# Patient Record
Sex: Male | Born: 1955 | Race: White | Hispanic: No | Marital: Married | Smoking: Never smoker
Health system: Southern US, Community
[De-identification: ages and names within clinical notes are randomized; demographics above are authoritative.]

## PROBLEM LIST (undated history)

## (undated) DIAGNOSIS — J45909 Unspecified asthma, uncomplicated: Secondary | ICD-10-CM

## (undated) DIAGNOSIS — E785 Hyperlipidemia, unspecified: Secondary | ICD-10-CM

## (undated) HISTORY — DX: Unspecified asthma, uncomplicated: J45.909

## (undated) HISTORY — DX: Hyperlipidemia, unspecified: E78.5

---

## 2017-04-04 ENCOUNTER — Ambulatory Visit (INDEPENDENT_AMBULATORY_CARE_PROVIDER_SITE_OTHER): Payer: 59 | Admitting: Cardiology

## 2017-04-04 ENCOUNTER — Encounter (INDEPENDENT_AMBULATORY_CARE_PROVIDER_SITE_OTHER): Payer: Self-pay

## 2017-04-04 ENCOUNTER — Encounter: Payer: Self-pay | Admitting: Cardiology

## 2017-04-04 VITALS — BP 116/60 | HR 68 | Ht 71.0 in | Wt 231.0 lb

## 2017-04-04 DIAGNOSIS — E782 Mixed hyperlipidemia: Secondary | ICD-10-CM

## 2017-04-04 DIAGNOSIS — R0789 Other chest pain: Secondary | ICD-10-CM | POA: Insufficient documentation

## 2017-04-04 DIAGNOSIS — R0609 Other forms of dyspnea: Secondary | ICD-10-CM

## 2017-04-04 DIAGNOSIS — R079 Chest pain, unspecified: Secondary | ICD-10-CM | POA: Insufficient documentation

## 2017-04-04 DIAGNOSIS — I200 Unstable angina: Secondary | ICD-10-CM | POA: Diagnosis not present

## 2017-04-04 DIAGNOSIS — R0600 Dyspnea, unspecified: Secondary | ICD-10-CM

## 2017-04-04 NOTE — Patient Instructions (Signed)
Medication Instructions:   Your physician recommends that you continue on your current medications as directed. Please refer to the Current Medication list given to you today.      Testing/Procedures:  CORONARY CT WITH FFR TO BE SCHEDULED ASAP PER DR NELSON---DR NELSON TO READ THIS STUDY      Follow-Up:  BASED ON YOUR CT RESULTS       If you need a refill on your cardiac medications before your next appointment, please call your pharmacy.

## 2017-04-04 NOTE — Progress Notes (Signed)
Cardiology Office Note:    Date:  04/04/2017   ID:  Leonard LamasJimmy Hawkins, DOB 04-06-1956, MRN [id number]  PCP:  System, Pcp Not In  Cardiologist:  Tobias AlexanderKatarina Cosima Prentiss, MD    Referring MD: Dr Charlies ConstableBruce Brodie  Chief complain: Chest pain  History of Present Illness:    Leonard Hawkins is a 61 y.o. male with a hx of Premature coronary artery disease in his father, cardiomegaly in his brother, hyperlipidemia who has been experiencing exertional dyspnea on exertion associated with dizziness and diaphoresis and retrosternal chest pressure. He has had several episodes in the last week including when he walk with his family and he couldn't finish the walk as he felt profoundly short of breath. He was tested by his primary care physician prolapse recently and was found to have elevated LDL for which she was started on Zocor and stopped taking it as he developed significant myalgias. He denies any prior syncope. No palpitations or claudications, no lower extremity edema orthopnea or proximal nocturnal dyspnea.  Labs from March 2018: Triglycerides 164, HDL 51, LDL 144 Normal electrolytes, creatinine 1.1 AST 20 ALT 31 glucose 113  Past Medical History:  Diagnosis Date  . Asthma   . Hyperlipidemia     History reviewed. No pertinent surgical history.  Current Medications: Current Meds  Medication Sig  . esomeprazole (NEXIUM) 40 MG capsule Take 1 capsule by mouth daily.     Allergies:   Cashew nut oil and Polyethylene glycol   Social History   Social History  . Marital status: Married    Spouse name: N/A  . Number of children: N/A  . Years of education: N/A   Social History Main Topics  . Smoking status: Never Smoker  . Smokeless tobacco: Former NeurosurgeonUser    Quit date: 04/04/1977  . Alcohol use Yes     Comment: social drinker  . Drug use: No  . Sexual activity: Yes    Birth control/ protection: None   Other Topics Concern  . None   Social History Narrative  . None    Family History: The  patient's family history includes AAA (abdominal aortic aneurysm) in his sister; Heart attack in his father; Heart disease in his father; Ovarian cancer in his mother.   ROS:   Please see the history of present illness.     All other systems reviewed and are negative.  EKGs/Labs/Other Studies Reviewed:    The following studies were reviewed today:   EKG:  EKG is  ordered today.  The ekg ordered today demonstrates Normal sinus rhythm minimal voltage criteria for LVH otherwise normal EKG no prior EKG available for comparison.  Recent Labs: No results found for requested labs within last 8760 hours.   Recent Lipid Panel No results found for: CHOL, TRIG, HDL, CHOLHDL, VLDL, LDLCALC, LDLDIRECT  Physical Exam:    VS:  BP 116/60   Pulse 68   Ht 5\' 11"  (1.803 m)   Wt 231 lb (104.8 kg)   SpO2 98%   BMI 32.22 kg/m     Wt Readings from Last 3 Encounters:  04/04/17 231 lb (104.8 kg)     GEN:  Well nourished, well developed in no acute distress HEENT: Normal NECK: No JVD; No carotid bruits LYMPHATICS: No lymphadenopathy CARDIAC: RRR, no murmurs, rubs, gallops RESPIRATORY:  Clear to auscultation without rales, wheezing or rhonchi  ABDOMEN: Soft, non-tender, non-distended MUSCULOSKELETAL:  No edema; No deformity  SKIN: Warm and dry NEUROLOGIC:  Alert and oriented x 3 PSYCHIATRIC:  Normal affect   ASSESSMENT:    1. Atypical chest pain    PLAN:    In order of problems listed above:  1. Typical Exertional chest pain shortness of breath and diaphoresis with known untreated hyperlipidemia and family history of early CAD, we'll schedule a coronary CTA with calcium score and if needed CT FFR for complete evaluation of the CAD burden.  2. Hyperlipidemia - we will obtain records from Dr. Clinton Sawyer for lipids and CMP, he is intolerant to Zocor based on results we will try Crestor next and see if tolerated. Dose will be decided based on CAD burden on coronary CTA.   Medication  Adjustments/Labs and Tests Ordered: Current medicines are reviewed at length with the patient today.  Concerns regarding medicines are outlined above. Labs and tests ordered and medication changes are outlined in the patient instructions below:  There are no Patient Instructions on file for this visit.   Signed, Tobias Alexander, MD  04/04/2017 11:31 AM     [facility]

## 2017-04-05 ENCOUNTER — Telehealth (HOSPITAL_COMMUNITY): Payer: Self-pay | Admitting: *Deleted

## 2017-04-05 ENCOUNTER — Encounter: Payer: Self-pay | Admitting: Cardiology

## 2017-04-05 ENCOUNTER — Telehealth: Payer: Self-pay | Admitting: *Deleted

## 2017-04-05 DIAGNOSIS — R072 Precordial pain: Secondary | ICD-10-CM

## 2017-04-05 DIAGNOSIS — R0609 Other forms of dyspnea: Secondary | ICD-10-CM

## 2017-04-05 NOTE — Telephone Encounter (Signed)
Pts calcium score is scheduled same day as myoview, on 04/08/17 at 1145.  Left a detailed message on the pts confirmed VM that this was approved to be scheduled same day as his myoview, and this will cost $150.  Advised the pt to call back with any further questions regarding message left.

## 2017-04-05 NOTE — Telephone Encounter (Signed)
Left message on voicemail in reference to upcoming appointment scheduled for 04/08/17. Phone number given for a call back so details instructions can be given. Leonard ChimesSharon Hawkins

## 2017-04-05 NOTE — Telephone Encounter (Signed)
-----   Message from [facility] Center[facility]armaine Hall sent at 04/05/2017 12:09 PM EDT ----- Regarding: Denied CCTA w/FFR Dr. Delton SeeNelson  Cardiac CTA was denied by insurance d/t pt hasn't had exercise imaging aka nuc stress test that was uninterpretable, suspected false + or unequivocable.  You can speak w/them at [phone]#[id number] however pt doesn't meet medical criteria and don't think it will change the denial.  Thanks  Charmaine

## 2017-04-05 NOTE — Telephone Encounter (Signed)
Order for exercise myoview placed for chest pain and DOE, per Dr Delton SeeNelson.  Pts Coronary CT W FFR was denied by [place].  Pt will have exercise myoview done on Monday 6/11 at 0945.  Omar PersonSharon Ferguson to send for a STAT pre-cert on this test scheduled for Monday 6/11.  Jasmine DecemberSharon to also check with our CT dept to see if we could arrange for a Calcium Score to be done same day, as he comes in for his myoview.   Spoke with the pt and informed him of exercise myoview scheduled for Monday 6/11 at 0945, he needs to arrive at 0915.   Went over exercise [employer] Pont[employer]oview instructions with the pt over the phone.  Pt verbalized understanding and agrees with this plan.   Also informed the pt that per Dr Delton SeeNelson, we will order for him to have a calcium score done as well.  Informed the pt that this is a $150 flat rate and we also do these in the office.  Informed the pt that we are trying to have this arranged same day as his myoview, but I will have to call him back to confirm this, for our CT scheduler stepped out of the office for lunch.  Pt verbalized understanding and agreed to this plan as well.

## 2017-04-08 ENCOUNTER — Telehealth: Payer: Self-pay | Admitting: *Deleted

## 2017-04-08 ENCOUNTER — Ambulatory Visit (INDEPENDENT_AMBULATORY_CARE_PROVIDER_SITE_OTHER)
Admission: RE | Admit: 2017-04-08 | Discharge: 2017-04-08 | Disposition: A | Discharge status: Home / Self Care | Payer: 59 | Source: Ambulatory Visit | Attending: Cardiology | Admitting: Cardiology

## 2017-04-08 ENCOUNTER — Ambulatory Visit (HOSPITAL_COMMUNITY): Payer: 59 | Attending: Cardiovascular Disease

## 2017-04-08 DIAGNOSIS — R0609 Other forms of dyspnea: Secondary | ICD-10-CM | POA: Diagnosis not present

## 2017-04-08 DIAGNOSIS — R931 Abnormal findings on diagnostic imaging of heart and coronary circulation: Secondary | ICD-10-CM | POA: Insufficient documentation

## 2017-04-08 DIAGNOSIS — R072 Precordial pain: Secondary | ICD-10-CM

## 2017-04-08 DIAGNOSIS — E785 Hyperlipidemia, unspecified: Secondary | ICD-10-CM | POA: Insufficient documentation

## 2017-04-08 LAB — MYOCARDIAL PERFUSION IMAGING
Estimated workload: 11.7 METS
Exercise duration (min): 10 min
Exercise duration (sec): 0 s
LV dias vol: 124 mL (ref 62–150)
LV sys vol: 61 mL
MPHR: 160 {beats}/min
Peak HR: 146 {beats}/min
Percent HR: 92 %
Percent of predicted max HR: 91 %
RATE: 0.37
RPE: 18
Rest HR: 60 {beats}/min
SDS: 2
SRS: 4
SSS: 6
Stage 1 DBP: 101 mmHg
Stage 1 Grade: 0 %
Stage 1 HR: 73 {beats}/min
Stage 1 SBP: 148 mmHg
Stage 1 Speed: 0 mph
Stage 2 Grade: 0 %
Stage 2 HR: 72 {beats}/min
Stage 2 Speed: 0 mph
Stage 3 DBP: 95 mmHg
Stage 3 Grade: 10 %
Stage 3 HR: 85 {beats}/min
Stage 3 SBP: 161 mmHg
Stage 3 Speed: 1.7 mph
Stage 4 DBP: 95 mmHg
Stage 4 Grade: 12 %
Stage 4 HR: 105 {beats}/min
Stage 4 SBP: 152 mmHg
Stage 4 Speed: 2.5 mph
Stage 5 DBP: 88 mmHg
Stage 5 Grade: 14 %
Stage 5 HR: 134 {beats}/min
Stage 5 SBP: 170 mmHg
Stage 5 Speed: 3.4 mph
Stage 6 Grade: 16 %
Stage 6 HR: 146 {beats}/min
Stage 6 Speed: 4.2 mph
Stage 7 DBP: 85 mmHg
Stage 7 Grade: 0 %
Stage 7 HR: 129 {beats}/min
Stage 7 SBP: 154 mmHg
Stage 7 Speed: 0 mph
Stage 8 DBP: 91 mmHg
Stage 8 Grade: 0 %
Stage 8 HR: 85 {beats}/min
Stage 8 SBP: 151 mmHg
Stage 8 Speed: 0 mph
TID: 0.86

## 2017-04-08 MED ORDER — ROSUVASTATIN CALCIUM 10 MG PO TABS: 10.0000 mg | ORAL_TABLET | Freq: Every day | ORAL | 1 refills | Status: DC

## 2017-04-08 MED ORDER — TECHNETIUM TC 99M TETROFOSMIN IV KIT
32.8000 | PACK | Freq: Once | INTRAVENOUS | Status: AC | PRN
  Administered 2017-04-08: 32.8 via INTRAVENOUS
  Filled 2017-04-08: qty 33

## 2017-04-08 MED ORDER — TECHNETIUM TC 99M TETROFOSMIN IV KIT
10.8000 | PACK | Freq: Once | INTRAVENOUS | Status: AC | PRN
  Administered 2017-04-08: 10.8 via INTRAVENOUS
  Filled 2017-04-08: qty 11

## 2017-04-08 NOTE — Telephone Encounter (Signed)
-----   Message from Lars MassonKatarina H Nelson, MD sent at 04/08/2017  3:44 PM EDT ----- Very high calcium score, no ischemia on stress test, however artifact on the study, he will need further evaluation with coronary CTA, please order sometimes in July (we will have a new scanner by then). Please prescribe crestor 10 mg po daily, repeat lipids and CMP in 1 month.  I have talked to him on the phone, he should start crestor after he comes back from [place] on 04/19/2017. Thank you, Aris LotKatarina

## 2017-04-08 NOTE — Telephone Encounter (Signed)
-----   Message from Katarina H Nelson, MD sent at 04/08/2017  3:44 PM EDT ----- Very high calcium score, no ischemia on stress test, however artifact on the study, he will need further evaluation with coronary CTA, please order sometimes in July (we will have a new scanner by then). Please prescribe crestor 10 mg po daily, repeat lipids and CMP in 1 month.  I have talked to him on the phone, he should start crestor after he comes back from [place] on 04/19/2017. Thank you, Katarina 

## 2017-04-08 NOTE — Telephone Encounter (Signed)
Notified the pt that per Dr Delton SeeNelson, he had a very high calcium score, no ischemia on stress test, however artifact on the study, he will need further evaluation with coronary CTA, please order sometimes in July (we will have a new scanner by then).   Also informed the pt that per Dr Delton SeeNelson, we will prescribe him crestor 10 mg po daily, repeat lipids and CMP in 1 month.   I have talked to him on the phone, he should start crestor after he comes back from [place] on 04/19/2017.  Thank you,  Aris LotKatarina  Confirmed the pharmacy of choice with the pt.  Informed the pt that per Dr Delton SeeNelson, he should start taking this on 6/22, when he comes back from his trip.  Scheduled the pt a lab appt for 05/17/17 to check a cmet and lipids.  Pt is aware to come fasting to this lab appt.  Informed the pt that I will place the order for him to have a coronary CTA arranged for July, when our new scanner arrives.  Informed the pt that this is per Dr Delton SeeNelson.  Informed the pt that I will have [facility] Jasmine DecemberSharon, call him back and arrange this for July, when the new scanner arrives.  Pt verbalized understanding and agrees with this plan.

## 2017-04-11 ENCOUNTER — Telehealth: Payer: Self-pay | Admitting: *Deleted

## 2017-04-11 ENCOUNTER — Encounter: Payer: Self-pay | Admitting: Cardiology

## 2017-04-11 NOTE — Telephone Encounter (Signed)
Message   04-11-17 schedule july 2nd week with nelson/saf  X 3 tests---

## 2017-04-19 ENCOUNTER — Telehealth: Payer: Self-pay | Admitting: *Deleted

## 2017-04-19 NOTE — Telephone Encounter (Signed)
Pt called and left a message [facility] voicemail. Per Jasmine DecemberSharon, the pt left a voicemail to inform our office that he is back from his trip from [place].  Pt left a VM that while he was on his trip to [place], he did have 1 episode of dizziness.  Attempted to call the pt back to obtain further information about his complaint while on his trip, and the pt did not answer.  Did leave a detailed message for the pt to call back, to further discuss this.

## 2017-04-21 ENCOUNTER — Encounter: Payer: Self-pay | Admitting: Cardiology

## 2017-04-22 ENCOUNTER — Encounter: Payer: Self-pay | Admitting: Cardiology

## 2017-04-22 NOTE — Telephone Encounter (Signed)
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 There is also medical/sport compression stocking from company called [employer], they sell at the local running store [place] [address] [employer] where they can also measure your size.   We reapplied for your coronary CT, which you can see is a denial for the first order but now that we have a stress test we were able to reapply and that application is still in process.  We're currently getting a new much better quality scanner and will be installed second week of July I would prefer that we scan you on that new more modern and the more advanced scanner than what we currently have.   I hope you feel better, warm regards   Leonard Hawkins

## 2017-04-30 ENCOUNTER — Encounter: Payer: Self-pay | Admitting: Cardiology

## 2017-05-02 ENCOUNTER — Encounter: Payer: Self-pay | Admitting: Cardiology

## 2017-05-07 ENCOUNTER — Other Ambulatory Visit: Payer: 59

## 2017-05-13 ENCOUNTER — Encounter: Payer: Self-pay | Admitting: Cardiology

## 2017-05-13 ENCOUNTER — Other Ambulatory Visit: Payer: 59 | Admitting: *Deleted

## 2017-05-13 DIAGNOSIS — E785 Hyperlipidemia, unspecified: Secondary | ICD-10-CM

## 2017-05-14 ENCOUNTER — Ambulatory Visit (HOSPITAL_COMMUNITY)
Admission: RE | Admit: 2017-05-14 | Discharge: 2017-05-14 | Disposition: A | Discharge status: Home / Self Care | Payer: 59 | Source: Ambulatory Visit | Attending: Cardiology | Admitting: Cardiology

## 2017-05-14 ENCOUNTER — Other Ambulatory Visit: Payer: 59

## 2017-05-14 DIAGNOSIS — R931 Abnormal findings on diagnostic imaging of heart and coronary circulation: Secondary | ICD-10-CM

## 2017-05-14 DIAGNOSIS — I712 Thoracic aortic aneurysm, without rupture: Secondary | ICD-10-CM | POA: Diagnosis not present

## 2017-05-14 DIAGNOSIS — R072 Precordial pain: Secondary | ICD-10-CM

## 2017-05-14 DIAGNOSIS — I200 Unstable angina: Secondary | ICD-10-CM | POA: Diagnosis present

## 2017-05-14 DIAGNOSIS — E785 Hyperlipidemia, unspecified: Secondary | ICD-10-CM

## 2017-05-14 DIAGNOSIS — K760 Fatty (change of) liver, not elsewhere classified: Secondary | ICD-10-CM | POA: Insufficient documentation

## 2017-05-14 DIAGNOSIS — I25110 Atherosclerotic heart disease of native coronary artery with unstable angina pectoris: Secondary | ICD-10-CM | POA: Diagnosis not present

## 2017-05-14 DIAGNOSIS — I700 Atherosclerosis of aorta: Secondary | ICD-10-CM | POA: Insufficient documentation

## 2017-05-14 DIAGNOSIS — R079 Chest pain, unspecified: Secondary | ICD-10-CM | POA: Diagnosis not present

## 2017-05-14 LAB — COMPREHENSIVE METABOLIC PANEL
ALT: 22 IU/L (ref 0–44)
AST: 20 IU/L (ref 0–40)
Albumin/Globulin Ratio: 2.3 — ABNORMAL HIGH (ref 1.2–2.2)
Albumin: 4.6 g/dL (ref 3.6–4.8)
Alkaline Phosphatase: 49 IU/L (ref 39–117)
BUN/Creatinine Ratio: 16 (ref 10–24)
BUN: 15 mg/dL (ref 8–27)
Bilirubin Total: 0.6 mg/dL (ref 0.0–1.2)
CO2: 25 mmol/L (ref 20–29)
Calcium: 9.5 mg/dL (ref 8.6–10.2)
Chloride: 102 mmol/L (ref 96–106)
Creatinine, Ser: 0.96 mg/dL (ref 0.76–1.27)
GFR calc Af Amer: 99 mL/min/{1.73_m2} (ref >59)
GFR calc non Af Amer: 86 mL/min/{1.73_m2} (ref >59)
Globulin, Total: 2 g/dL (ref 1.5–4.5)
Glucose: 113 mg/dL — ABNORMAL HIGH (ref 65–99)
Potassium: 4.4 mmol/L (ref 3.5–5.2)
Sodium: 136 mmol/L (ref 134–144)
Total Protein: 6.6 g/dL (ref 6.0–8.5)

## 2017-05-14 LAB — LIPID PANEL
Chol/HDL Ratio: 2.9 ratio (ref 0.0–5.0)
Cholesterol, Total: 143 mg/dL (ref 100–199)
HDL: 50 mg/dL (ref >39)
LDL Calculated: 59 mg/dL (ref 0–99)
Triglycerides: 169 mg/dL — ABNORMAL HIGH (ref 0–149)
VLDL Cholesterol Cal: 34 mg/dL (ref 5–40)

## 2017-05-14 MED ORDER — IOPAMIDOL (ISOVUE-370) INJECTION 76%
INTRAVENOUS | Status: AC
  Administered 2017-05-14: 80 mL via INTRAVENOUS
  Filled 2017-05-14: qty 100

## 2017-05-14 MED ORDER — NITROGLYCERIN 0.4 MG SL SUBL
SUBLINGUAL_TABLET | SUBLINGUAL | Status: AC
  Filled 2017-05-14: qty 2

## 2017-05-14 MED ORDER — SODIUM CHLORIDE 0.9 % IJ SOLN: 1.5000 mg | INTRAMUSCULAR | Status: DC

## 2017-05-14 MED ORDER — NITROGLYCERIN 0.4 MG SL SUBL
0.8000 mg | SUBLINGUAL_TABLET | Freq: Once | SUBLINGUAL | Status: AC
  Administered 2017-05-14: 0.8 mg via SUBLINGUAL
  Filled 2017-05-14: qty 25

## 2017-05-14 MED ORDER — IOPAMIDOL (ISOVUE-370) INJECTION 76%
INTRAVENOUS | Status: AC
  Filled 2017-05-14: qty 100

## 2017-05-15 ENCOUNTER — Encounter: Payer: Self-pay | Admitting: *Deleted

## 2017-05-15 ENCOUNTER — Telehealth: Payer: Self-pay | Admitting: *Deleted

## 2017-05-15 DIAGNOSIS — Z01812 Encounter for preprocedural laboratory examination: Secondary | ICD-10-CM | POA: Insufficient documentation

## 2017-05-15 DIAGNOSIS — R931 Abnormal findings on diagnostic imaging of heart and coronary circulation: Secondary | ICD-10-CM | POA: Insufficient documentation

## 2017-05-15 DIAGNOSIS — R0609 Other forms of dyspnea: Secondary | ICD-10-CM

## 2017-05-15 DIAGNOSIS — R072 Precordial pain: Secondary | ICD-10-CM

## 2017-05-15 MED ORDER — FISH OIL 1000 MG PO CAPS: 1000.0000 mg | ORAL_CAPSULE | Freq: Every day | ORAL | 6 refills | Status: DC

## 2017-05-15 MED ORDER — ASPIRIN EC 81 MG PO TBEC: 81.0000 mg | DELAYED_RELEASE_TABLET | Freq: Every day | ORAL | 3 refills | Status: DC

## 2017-05-15 NOTE — Telephone Encounter (Signed)
Notes recorded by Lars MassonNelson, Katarina H, MD on 05/15/2017 at 11:44 AM EDT Normal CMP, LDL excellent, elevated triglycerides, please start 1 g of fish oil. Daily

## 2017-05-15 NOTE — Telephone Encounter (Signed)
-----   Message from Lars MassonKatarina H Nelson, MD sent at 05/15/2017 12:50 PM EDT ----- 1. 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 RCA is small and non-dominant.  A left cardiac catheterization is recommended.

## 2017-05-15 NOTE — Telephone Encounter (Signed)
Spoke with the pt and informed him of his cardiac CT results and recommendations, per Dr Delton SeeNelson,  for him to start taking ASA 81 mg po daily, fish oil 1 Gram po daily, and have a left cardiac cath done.   Informed the pt that per Dr Delton SeeNelson, his coronary ct showed 1. Functionally significant stenosis in the first diagonal vessel  and mid to distal LAD. RCA is small and non-dominant, and a left cardiac cath is recommended.   Pt requested that his pre-cath labs be scheduled for this Friday 7/20, and left cardiac cath to be done on next Thursday 7/26, early morning case.  Scheduled the pt for his pre-cath labs for this Friday, 7/20, to check a bmet, PT/INR, and CBC W Diff.   Scheduled the pts left cardiac cath to be done on next Thursday 7/26, at 0730 for Dr Katrinka BlazingSmith to do.   In[facility] the [facility], the morning of his scheduled cath.   Informed the pt that I will have his cardiac cath instructions available for him to pick up at the front desk, when he comes in for labs this Friday, 7/20.  Advised the pt to ask for me this Friday, if he has any questions regarding his cath.  Confirmed the pharmacy of choice with the pt.   Pt verbalized understanding and agrees with this plan.   Will send pre-cert a message for his scheduled cath, and Dr Delton SeeNelson a message to place hospital orders in the system, for 7/26 cath.

## 2017-05-15 NOTE — Addendum Note (Signed)
Encounter addended by: Lars MassonNelson, Arty Lantzy H, MD on: 05/15/2017 11:05 PM<BR>    Actions taken: Order list changed

## 2017-05-17 ENCOUNTER — Other Ambulatory Visit: Payer: 59 | Admitting: *Deleted

## 2017-05-17 ENCOUNTER — Encounter: Payer: Self-pay | Admitting: *Deleted

## 2017-05-17 ENCOUNTER — Other Ambulatory Visit: Payer: 59

## 2017-05-17 DIAGNOSIS — Z01812 Encounter for preprocedural laboratory examination: Secondary | ICD-10-CM

## 2017-05-17 DIAGNOSIS — R931 Abnormal findings on diagnostic imaging of heart and coronary circulation: Secondary | ICD-10-CM

## 2017-05-17 DIAGNOSIS — R072 Precordial pain: Secondary | ICD-10-CM

## 2017-05-17 DIAGNOSIS — R0609 Other forms of dyspnea: Secondary | ICD-10-CM

## 2017-05-17 LAB — CBC WITH DIFFERENTIAL/PLATELET
Basophils Absolute: 0 10*3/uL (ref 0.0–0.2)
Basos: 0 %
EOS (ABSOLUTE): 0.1 10*3/uL (ref 0.0–0.4)
Eos: 2 %
Hematocrit: 43.1 % (ref 37.5–51.0)
Hemoglobin: 15.3 g/dL (ref 13.0–17.7)
Immature Grans (Abs): 0 10*3/uL (ref 0.0–0.1)
Immature Granulocytes: 1 %
Lymphocytes Absolute: 2 10*3/uL (ref 0.7–3.1)
Lymphs: 29 %
MCH: 29.8 pg (ref 26.6–33.0)
MCHC: 35.5 g/dL (ref 31.5–35.7)
MCV: 84 fL (ref 79–97)
Monocytes Absolute: 0.6 10*3/uL (ref 0.1–0.9)
Monocytes: 9 %
Neutrophils Absolute: 4.1 10*3/uL (ref 1.4–7.0)
Neutrophils: 59 %
Platelets: 212 10*3/uL (ref 150–379)
RBC: 5.13 x10E6/uL (ref 4.14–5.80)
RDW: 13.1 % (ref 12.3–15.4)
WBC: 6.8 10*3/uL (ref 3.4–10.8)

## 2017-05-17 LAB — PROTIME-INR
INR: 1 (ref 0.8–1.2)
Prothrombin Time: 10.9 s (ref 9.1–12.0)

## 2017-05-22 ENCOUNTER — Telehealth: Payer: Self-pay

## 2017-05-22 NOTE — Telephone Encounter (Signed)
[facility] for:  05/23/2017 @ 0730 Verified arrival time and place:  yes Confirmed AM meds to be taken pre-cath with sip of water:  Notified to take ASA prior to arrival Confirmed patient has responsible person to drive home post procedure and observe patient for 24 hours: yes Addl concerns:  none

## 2017-05-23 ENCOUNTER — Encounter (HOSPITAL_COMMUNITY)
Admission: RE | Disposition: A | Discharge status: Home / Self Care | Payer: Self-pay | Source: Ambulatory Visit | Attending: Interventional Cardiology

## 2017-05-23 ENCOUNTER — Ambulatory Visit (HOSPITAL_COMMUNITY)
Admission: RE | Admit: 2017-05-23 | Discharge: 2017-05-23 | Disposition: A | Discharge status: Home / Self Care | Payer: 59 | Source: Ambulatory Visit | Attending: Interventional Cardiology | Admitting: Interventional Cardiology

## 2017-05-23 ENCOUNTER — Encounter (HOSPITAL_COMMUNITY): Payer: Self-pay | Admitting: Interventional Cardiology

## 2017-05-23 DIAGNOSIS — I2510 Atherosclerotic heart disease of native coronary artery without angina pectoris: Secondary | ICD-10-CM

## 2017-05-23 DIAGNOSIS — J45909 Unspecified asthma, uncomplicated: Secondary | ICD-10-CM | POA: Diagnosis not present

## 2017-05-23 DIAGNOSIS — R0609 Other forms of dyspnea: Secondary | ICD-10-CM

## 2017-05-23 DIAGNOSIS — E785 Hyperlipidemia, unspecified: Secondary | ICD-10-CM | POA: Diagnosis not present

## 2017-05-23 DIAGNOSIS — R079 Chest pain, unspecified: Secondary | ICD-10-CM | POA: Diagnosis present

## 2017-05-23 DIAGNOSIS — R0789 Other chest pain: Secondary | ICD-10-CM | POA: Diagnosis not present

## 2017-05-23 DIAGNOSIS — Z8249 Family history of ischemic heart disease and other diseases of the circulatory system: Secondary | ICD-10-CM | POA: Insufficient documentation

## 2017-05-23 DIAGNOSIS — R931 Abnormal findings on diagnostic imaging of heart and coronary circulation: Secondary | ICD-10-CM | POA: Diagnosis present

## 2017-05-23 HISTORY — PX: LEFT HEART CATH AND CORONARY ANGIOGRAPHY: CATH118249

## 2017-05-23 SURGERY — LEFT HEART CATH AND CORONARY ANGIOGRAPHY
Anesthesia: LOCAL

## 2017-05-23 MED ORDER — SODIUM CHLORIDE 0.9% FLUSH: 3.0000 mL | Freq: Two times a day (BID) | INTRAVENOUS | Status: DC

## 2017-05-23 MED ORDER — HEPARIN SODIUM (PORCINE) 1000 UNIT/ML IJ SOLN
INTRAMUSCULAR | Status: AC
  Filled 2017-05-23: qty 1

## 2017-05-23 MED ORDER — OXYCODONE-ACETAMINOPHEN 5-325 MG PO TABS: 1.0000 | ORAL_TABLET | ORAL | Status: DC | PRN

## 2017-05-23 MED ORDER — FENTANYL CITRATE (PF) 100 MCG/2ML IJ SOLN
INTRAMUSCULAR | Status: DC | PRN
  Administered 2017-05-23: 50 ug via INTRAVENOUS

## 2017-05-23 MED ORDER — SODIUM CHLORIDE 0.9% FLUSH: 3.0000 mL | INTRAVENOUS | Status: DC | PRN

## 2017-05-23 MED ORDER — ASPIRIN 81 MG PO CHEW: 81.0000 mg | CHEWABLE_TABLET | Freq: Every day | ORAL | Status: DC

## 2017-05-23 MED ORDER — MIDAZOLAM HCL 2 MG/2ML IJ SOLN
INTRAMUSCULAR | Status: DC | PRN
  Administered 2017-05-23 (×2): 1 mg via INTRAVENOUS

## 2017-05-23 MED ORDER — HEPARIN (PORCINE) IN NACL 2-0.9 UNIT/ML-% IJ SOLN
INTRAMUSCULAR | Status: AC | PRN
  Administered 2017-05-23: 1000 mL

## 2017-05-23 MED ORDER — ASPIRIN 81 MG PO CHEW: 81.0000 mg | CHEWABLE_TABLET | ORAL | Status: DC

## 2017-05-23 MED ORDER — HEPARIN (PORCINE) IN NACL 2-0.9 UNIT/ML-% IJ SOLN
INTRAMUSCULAR | Status: AC
  Filled 2017-05-23: qty 500

## 2017-05-23 MED ORDER — SODIUM CHLORIDE 0.9 % WEIGHT BASED INFUSION
3.0000 mL/kg/h | INTRAVENOUS | Status: DC
  Administered 2017-05-23: 3 mL/kg/h via INTRAVENOUS

## 2017-05-23 MED ORDER — LIDOCAINE HCL (PF) 1 % IJ SOLN
INTRAMUSCULAR | Status: AC
Start: 2017-05-23 — End: 2017-05-23
  Filled 2017-05-23: qty 30

## 2017-05-23 MED ORDER — SODIUM CHLORIDE 0.9 % IV SOLN: INTRAVENOUS | Status: DC

## 2017-05-23 MED ORDER — SODIUM CHLORIDE 0.9 % IV SOLN: 250.0000 mL | INTRAVENOUS | Status: DC | PRN

## 2017-05-23 MED ORDER — FENTANYL CITRATE (PF) 100 MCG/2ML IJ SOLN
INTRAMUSCULAR | Status: AC
  Filled 2017-05-23: qty 2

## 2017-05-23 MED ORDER — MIDAZOLAM HCL 2 MG/2ML IJ SOLN
INTRAMUSCULAR | Status: AC
  Filled 2017-05-23: qty 2

## 2017-05-23 MED ORDER — ONDANSETRON HCL 4 MG/2ML IJ SOLN: 4.0000 mg | Freq: Four times a day (QID) | INTRAMUSCULAR | Status: DC | PRN

## 2017-05-23 MED ORDER — HEPARIN SODIUM (PORCINE) 1000 UNIT/ML IJ SOLN
INTRAMUSCULAR | Status: DC | PRN
  Administered 2017-05-23: 5500 [IU] via INTRAVENOUS

## 2017-05-23 MED ORDER — LIDOCAINE HCL (PF) 1 % IJ SOLN
INTRAMUSCULAR | Status: DC | PRN
  Administered 2017-05-23: 1 mL via INTRADERMAL

## 2017-05-23 MED ORDER — VERAPAMIL HCL 2.5 MG/ML IV SOLN
INTRAVENOUS | Status: DC | PRN
  Administered 2017-05-23: 10 mL via INTRA_ARTERIAL

## 2017-05-23 MED ORDER — IOPAMIDOL (ISOVUE-370) INJECTION 76%
INTRAVENOUS | Status: AC
  Filled 2017-05-23: qty 100

## 2017-05-23 MED ORDER — VERAPAMIL HCL 2.5 MG/ML IV SOLN
INTRAVENOUS | Status: AC
  Filled 2017-05-23: qty 2

## 2017-05-23 MED ORDER — ACETAMINOPHEN 325 MG PO TABS: 650.0000 mg | ORAL_TABLET | ORAL | Status: DC | PRN

## 2017-05-23 MED ORDER — SODIUM CHLORIDE 0.9 % WEIGHT BASED INFUSION: 1.0000 mL/kg/h | INTRAVENOUS | Status: DC

## 2017-05-23 MED ORDER — IOPAMIDOL (ISOVUE-370) INJECTION 76%
INTRAVENOUS | Status: AC
  Filled 2017-05-23: qty 50

## 2017-05-23 SURGICAL SUPPLY — 11 items
CATH EXPO 5FR FR4 (CATHETERS) ×2 IMPLANT
CATH INFINITI 5FR JL4 (CATHETERS) ×2 IMPLANT
CATH LAUNCHER 5F EBU3.5 (CATHETERS) ×2 IMPLANT
DEVICE RAD COMP TR BAND LRG (VASCULAR PRODUCTS) ×2 IMPLANT
GLIDESHEATH SLEND A-KIT 6F 22G (SHEATH) ×2 IMPLANT
GUIDEWIRE INQWIRE 1.5J.035X260 (WIRE) ×1 IMPLANT
INQWIRE 1.5J .035X260CM (WIRE) ×2
KIT HEART LEFT (KITS) ×2 IMPLANT
PACK CARDIAC CATHETERIZATION (CUSTOM PROCEDURE TRAY) ×2 IMPLANT
TRANSDUCER W/STOPCOCK (MISCELLANEOUS) ×2 IMPLANT
TUBING CIL FLEX 10 FLL-RA (TUBING) ×2 IMPLANT

## 2017-05-23 NOTE — Interval H&P Note (Signed)
Cath Lab Visit (complete for each Cath Lab visit)  Clinical Evaluation Leading to the Procedure:   ACS: No.  Non-ACS:    Anginal Classification: CCS II  Anti-ischemic medical therapy: No Therapy  Non-Invasive Test Results: Low-risk stress test findings: cardiac mortality <1%/year  Prior CABG: No previous CABG      History and Physical Interval Note:  05/23/2017 7:42 AM  Leonard Hawkins  has presented today for surgery, with the diagnosis of abnormal cardiac CT  The various methods of treatment have been discussed with the patient and family. After consideration of risks, benefits and other options for treatment, the patient has consented to  Procedure(s): Left Heart Cath and Coronary Angiography (N/A) as a surgical intervention .  The patient's history has been reviewed, patient examined, no change in status, stable for surgery.  I have reviewed the patient's chart and labs.  Questions were answered to the patient's satisfaction.     Lyn RecordsHenry W Ronon Ferger III

## 2017-05-23 NOTE — H&P (Signed)
Cardiology  Expand All Collapse All   [] Hide copied text [] Hover for attribution information         Chief complain: Chest pain  History of Present Illness:    "Leonard Hawkins is a 61 y.o. male with a hx of Premature coronary artery disease in his father, cardiomegaly in his brother, hyperlipidemia who has been experiencing exertional dyspnea on exertion associated with dizziness and diaphoresis and retrosternal chest pressure. He has had several episodes in the last week including when he walk with his family and he couldn't finish the walk as he felt profoundly short of breath. He was tested by his primary care physician prolapse recently and was found to have elevated LDL for which she was started on Zocor and stopped taking it as he developed significant myalgias. He denies any prior syncope. No palpitations or claudications, no lower extremity edema orthopnea or proximal nocturnal dyspnea.  Labs from March 2018: Triglycerides 164, HDL 51, LDL 144 Normal electrolytes, creatinine 1.1 AST 20 ALT 31 glucose 113"  Above is the initial note by Dr. Delton SeeNelson. Since the above he has had a low risk nuclear study 04/08/2017.  CT angio with FFR revealed high calcium score and distal LAD FFR .77, D1 FFR .64, and distal RCA FFR .78.  No chest pain. Initial eval for symptoms of dizziness, diaphoresis, and fatigue.      Past Medical History:  Diagnosis Date  . Asthma   . Hyperlipidemia     History reviewed. No pertinent surgical history.  Current Medications: ActiveMedications      Current Meds  Medication Sig  . esomeprazole (NEXIUM) 40 MG capsule Take 1 capsule by mouth daily.       Allergies:   Cashew nut oil and Polyethylene glycol   Social History        Social History  . Marital status: Married    Spouse name: N/A  . Number of children: N/A  . Years of education: N/A         Social History Main Topics  . Smoking status: Never Smoker  . Smokeless  tobacco: Former NeurosurgeonUser    Quit date: 04/04/1977  . Alcohol use Yes     Comment: social drinker  . Drug use: No  . Sexual activity: Yes    Birth control/ protection: None       Other Topics Concern  . None      Social History Narrative  . None    Family History: The patient's family history includes AAA (abdominal aortic aneurysm) in his sister; Heart attack in his father; Heart disease in his father; Ovarian cancer in his mother.   ROS:   Please see the history of present illness.     All other systems reviewed and are negative.  EKGs/Labs/Other Studies Reviewed:    The following studies were reviewed today:   EKG:  EKG is  ordered today.  The ekg ordered today demonstrates Normal sinus rhythm minimal voltage criteria for LVH otherwise normal EKG no prior EKG available for comparison.  Recent Labs: No results found for requested labs within last 8760 hours.   Recent Lipid Panel Labs(Brief)  No results found for: CHOL, TRIG, HDL, CHOLHDL, VLDL, LDLCALC, LDLDIRECT    Physical Exam:    VS:  BP 116/60   Pulse 68   Ht 5\' 11"  (1.803 m)   Wt 231 lb (104.8 kg)   SpO2 98%   BMI 32.22 kg/m        Wt Readings  from Last 3 Encounters:  04/04/17 231 lb (104.8 kg)     GEN:  Well nourished, well developed in no acute distress HEENT: Normal NECK: No JVD; No carotid bruits LYMPHATICS: No lymphadenopathy CARDIAC: RRR, no murmurs, rubs, gallops RESPIRATORY:  Clear to auscultation without rales, wheezing or rhonchi  ABDOMEN: Soft, non-tender, non-distended MUSCULOSKELETAL:  No edema; No deformity  SKIN: Warm and dry NEUROLOGIC:  Alert and oriented x 3 PSYCHIATRIC:  Normal affect   ASSESSMENT:    1. Atypical chest pain    PLAN:    In order of problems listed above:  1. CAD documented by CT and symptoms that are non exertional. 2. Hyperlipidemia - we will obtain records from Dr. Clinton SawyerWilliamson for lipids and CMP, he is intolerant to Zocor based on  results we will try Crestor next and see if tolerated. Dose will be decided based on CAD burden on coronary CTA.  The patient was counseled to undergo left heart catheterization, coronary angiography, and possible percutaneous coronary intervention with stent implantation. The procedural risks and benefits were discussed in detail. The risks discussed included death, stroke, myocardial infarction, life-threatening bleeding, limb ischemia, kidney injury, allergy, and possible emergency cardiac surgery. The risk of these significant complications were estimated to occur less than 1% of the time. After discussion, the patient has agreed to proceed.

## 2017-05-23 NOTE — Discharge Instructions (Signed)

## 2017-06-04 ENCOUNTER — Telehealth: Payer: Self-pay

## 2017-06-04 NOTE — Telephone Encounter (Signed)
Call made to Pt.  Notified per Dr. Delton SeeNelson, she would like him to come in for a f/u visit 4-6 weeks after cath.  Scheduled with Jacolyn ReedyMichele Lenze for 07/02/2017 @ 0815 [facility].  Pt agreeable to follow up.

## 2017-06-21 ENCOUNTER — Encounter: Payer: Self-pay | Admitting: Physician Assistant

## 2017-06-27 NOTE — Telephone Encounter (Signed)
Pts appt rescheduled .  Cancelled appt with Herma CarsonMichelle Lenze on 9/4 and scheduled the pt with Dr Delton SeeNelson on 8/31 at 1140.  Dr Delton SeeNelson had a cancellation, so the pt was offered this appt date and time.  Pt agreed to seeing Dr Delton SeeNelson tomorrow at 1140. Pt aware to arrive 15 mins prior too this scheduled appt.  Pt verbalized understanding and agrees with this plan.

## 2017-06-28 ENCOUNTER — Ambulatory Visit (INDEPENDENT_AMBULATORY_CARE_PROVIDER_SITE_OTHER): Payer: 59 | Admitting: Cardiology

## 2017-06-28 ENCOUNTER — Encounter: Payer: Self-pay | Admitting: Cardiology

## 2017-06-28 VITALS — BP 118/86 | HR 72 | Ht 71.0 in | Wt 228.0 lb

## 2017-06-28 DIAGNOSIS — I25119 Atherosclerotic heart disease of native coronary artery with unspecified angina pectoris: Secondary | ICD-10-CM | POA: Diagnosis not present

## 2017-06-28 DIAGNOSIS — I7121 Aneurysm of the ascending aorta, without rupture: Secondary | ICD-10-CM

## 2017-06-28 DIAGNOSIS — I712 Thoracic aortic aneurysm, without rupture: Secondary | ICD-10-CM | POA: Diagnosis not present

## 2017-06-28 DIAGNOSIS — I10 Essential (primary) hypertension: Secondary | ICD-10-CM

## 2017-06-28 DIAGNOSIS — E785 Hyperlipidemia, unspecified: Secondary | ICD-10-CM | POA: Diagnosis not present

## 2017-06-28 DIAGNOSIS — R42 Dizziness and giddiness: Secondary | ICD-10-CM | POA: Diagnosis not present

## 2017-06-28 NOTE — Progress Notes (Signed)
Cardiology Office Note:    Date:  06/28/2017   ID:  Leonard Hawkins, DOB 06/20/1956, MRN [id number]  PCP:  Garnetta Buddy, MD  Cardiologist:  Tobias Alexander, MD    Referring MD: Dr Charlies Constable  Chief complain: Chest pain  History of Present Illness:    Leonard Hawkins is a 61 y.o. male with a hx of Premature coronary artery disease in his father, cardiomegaly in his brother, hyperlipidemia who has been experiencing exertional dyspnea on exertion associated with dizziness and diaphoresis and retrosternal chest pressure. He has had several episodes in the last week including when he walk with his family and he couldn't finish the walk as he felt profoundly short of breath. He was tested by his primary care physician prolapse recently and was found to have elevated LDL for which she was started on Zocor and stopped taking it as he developed significant myalgias. He denies any prior syncope. No palpitations or claudications, no lower extremity edema orthopnea or proximal nocturnal dyspnea.   06/28/2017 - 1 month follow up, Ca score 1086. This was 32 percentile for age and sex matched control. Coronary CTA showed two-vessel obstructive coronary artery disease. The patient underwent cardiac and there is irritation with Dr. Katrinka Blazing on 05/23/2017 that showed   75% first diagonal obstruction.  40% proximal LAD and 25% mid LAD obstruction.  25% first obtuse marginal  Tandem 40 and 20% RCA stenoses. Normal left ventricular function with EF 55%. Normal hemodynamics.  He feels better, no further chest pain, dizziness, diaphoresis, DOE. He is tolerating Crestor well after 10 back pain when he plays golf. He has changed his diet and started to exercise on the elliptical machine and has lost 10 pounds in the last months.  Labs from March 2018: Triglycerides 164, HDL 51, LDL 144 Normal electrolytes, creatinine 1.1 AST 20 ALT 31 glucose 113  Past Medical History:  Diagnosis Date  . Asthma   .  Hyperlipidemia     Past Surgical History:  Procedure Laterality Date  . LEFT HEART CATH AND CORONARY ANGIOGRAPHY N/A 05/23/2017   Procedure: Left Heart Cath and Coronary Angiography;  Surgeon: Lyn Records, MD;  Location: [facility] INVASIVE CV LAB;  Service: Cardiovascular;  Laterality: N/A;    Current Medications: Current Meds  Medication Sig  . aspirin EC 81 MG tablet Take 1 tablet (81 mg total) by mouth daily.  Marland Kitchen esomeprazole (NEXIUM) 40 MG capsule Take 40 mg by mouth daily as needed (acid reflux).   . Omega-3 Fatty Acids (FISH OIL) 1000 MG CAPS Take 1 capsule (1,000 mg total) by mouth daily.  Bertram Gala Glycol-Propyl Glycol (SYSTANE OP) Apply 1 drop to eye daily as needed (dry eyes).  . rosuvastatin (CRESTOR) 10 MG tablet Take 1 tablet (10 mg total) by mouth daily. (Patient taking differently: Take 10 mg by mouth at bedtime. )     Allergies:   Cashew nut oil and Polyethylene glycol   Social History   Social History  . Marital status: Married    Spouse name: N/A  . Number of children: N/A  . Years of education: N/A   Social History Main Topics  . Smoking status: Never Smoker  . Smokeless tobacco: Former Neurosurgeon    Quit date: 04/04/1977  . Alcohol use Yes     Comment: social drinker  . Drug use: No  . Sexual activity: Yes    Birth control/ protection: None   Other Topics Concern  . None   Social History Narrative  .  None    Family History: The patient's family history includes AAA (abdominal aortic aneurysm) in his sister; Heart attack in his father; Heart disease in his father; Ovarian cancer in his mother.   ROS:   Please see the history of present illness.     All other systems reviewed and are negative.  EKGs/Labs/Other Studies Reviewed:    The following studies were reviewed today:   EKG:  EKG is  ordered today.  The ekg ordered today demonstrates Normal sinus rhythm minimal voltage criteria for LVH otherwise normal EKG no prior EKG available for  comparison.  Recent Labs: 05/13/2017: ALT 22; BUN 15; Creatinine, Ser 0.96; Potassium 4.4; Sodium 136 05/17/2017: Hemoglobin 15.3; Platelets 212   Recent Lipid Panel    Component Value Date/Time   CHOL 143 05/13/2017 0903   TRIG 169 (H) 05/13/2017 0903   HDL 50 05/13/2017 0903   CHOLHDL 2.9 05/13/2017 0903   LDLCALC 59 05/13/2017 0903    Physical Exam:    VS:  BP 118/86   Pulse 72   Ht 5\' 11"  (1.803 m)   Wt 228 lb (103.4 kg)   SpO2 96%   BMI 31.80 kg/m     Wt Readings from Last 3 Encounters:  06/28/17 228 lb (103.4 kg)  05/23/17 227 lb (103 kg)  04/08/17 231 lb (104.8 kg)     GEN:  Well nourished, well developed in no acute distress HEENT: Normal NECK: No JVD; No carotid bruits LYMPHATICS: No lymphadenopathy CARDIAC: RRR, no murmurs, rubs, gallops RESPIRATORY:  Clear to auscultation without rales, wheezing or rhonchi  ABDOMEN: Soft, non-tender, non-distended MUSCULOSKELETAL:  No edema; No deformity  SKIN: Warm and dry NEUROLOGIC:  Alert and oriented x 3 PSYCHIATRIC:  Normal affect   ASSESSMENT:    1. Hyperlipidemia, unspecified hyperlipidemia type   2. Hypertension, unspecified type   3. Coronary artery disease involving native heart with angina pectoris, unspecified vessel or lesion type (HCC)   4. Dizziness and giddiness    PLAN:    In order of problems listed above:  1. CAD - with diffuse coronary artery disease on cardiac July 2018 with 75% stenosis in the first diagonal vessel that is small. LVEF 55-60%. Aggressive medical management with aspirin 81 mg daily and Crestor 10 mg daily walls initiated.  2. Hyperlipidemia - evidence of diffuse coronary artery disease, started on Crestor 10 mg daily he also changed his diet and lost weight. He is experiencing back pain. We will check his lipids, CMP, CPK and magnesium.  3. Dizziness - now resolved however we will check carotid ultrasound to rule out stenosis.  4. Ascending aortic aneurysm - up to 45 mm on  CTA, we will repeat in 6 months, no AI murmur on physical exam.  Medication Adjustments/Labs and Tests Ordered: Current medicines are reviewed at length with the patient today.  Concerns regarding medicines are outlined above. Labs and tests ordered and medication changes are outlined in the patient instructions below:  Patient Instructions  Medication Instructions:  Your physician recommends that you continue on your current medications as directed. Please refer to the Current Medication list given to you today.  Labwork: Your physician recommends that you have lab work the day of carotid us/CPK/BMET/MAG/CBC/TSH/LFT/LIPID   Testing/Procedures: Your physician has requested that you have a carotid duplex. This test is an ultrasound of the carotid arteries in your neck. It looks at blood flow through these arteries that supply the brain with blood. Allow one hour for this exam.  There are no restrictions or special instructions.    Follow-Up: Your physician wants you to follow-up in: 6 months with Dr. Delton See.  You will receive a reminder letter in the mail two months in advance. If you don't receive a letter, please call our office to schedule the follow-up appointment.   Any Other Special Instructions Will Be Listed Below (If Applicable).     If you need a refill on your cardiac medications before your next appointment, please call your pharmacy.      Signed, Tobias Alexander, MD  06/28/2017 12:32 PM     [facility]

## 2017-06-28 NOTE — Patient Instructions (Signed)
Medication Instructions:  Your physician recommends that you continue on your current medications as directed. Please refer to the Current Medication list given to you today.  Labwork: Your physician recommends that you have lab work the day of carotid us/CPK/BMET/MAG/CBC/TSH/LFT/LIPID   Testing/Procedures: Your physician has requested that you have a carotid duplex. This test is an ultrasound of the carotid arteries in your neck. It looks at blood flow through these arteries that supply the brain with blood. Allow one hour for this exam. There are no restrictions or special instructions.    Follow-Up: Your physician wants you to follow-up in: 6 months with Dr. Delton SeeNelson.  You will receive a reminder letter in the mail two months in advance. If you don't receive a letter, please call our office to schedule the follow-up appointment.   Any Other Special Instructions Will Be Listed Below (If Applicable).     If you need a refill on your cardiac medications before your next appointment, please call your pharmacy.

## 2017-07-02 ENCOUNTER — Ambulatory Visit: Payer: 59 | Admitting: Physician Assistant

## 2017-07-03 ENCOUNTER — Ambulatory Visit: Payer: 59 | Admitting: Physician Assistant

## 2017-07-03 ENCOUNTER — Other Ambulatory Visit: Payer: Self-pay | Admitting: Cardiology

## 2017-07-03 ENCOUNTER — Ambulatory Visit (HOSPITAL_COMMUNITY)
Admission: RE | Admit: 2017-07-03 | Discharge: 2017-07-03 | Disposition: A | Discharge status: Home / Self Care | Payer: 59 | Source: Ambulatory Visit | Attending: Cardiology | Admitting: Cardiology

## 2017-07-03 DIAGNOSIS — R42 Dizziness and giddiness: Secondary | ICD-10-CM | POA: Diagnosis not present

## 2017-07-03 DIAGNOSIS — I6523 Occlusion and stenosis of bilateral carotid arteries: Secondary | ICD-10-CM | POA: Diagnosis not present

## 2017-07-03 LAB — BASIC METABOLIC PANEL
BUN/Creatinine Ratio: 18 (ref 10–24)
BUN: 19 mg/dL (ref 8–27)
CO2: 24 mmol/L (ref 20–29)
Calcium: 9.4 mg/dL (ref 8.6–10.2)
Chloride: 97 mmol/L (ref 96–106)
Creatinine, Ser: 1.03 mg/dL (ref 0.76–1.27)
GFR calc Af Amer: 91 mL/min/{1.73_m2} (ref >59)
GFR calc non Af Amer: 79 mL/min/{1.73_m2} (ref >59)
Glucose: 106 mg/dL — ABNORMAL HIGH (ref 65–99)
Potassium: 5 mmol/L (ref 3.5–5.2)
Sodium: 138 mmol/L (ref 134–144)

## 2017-07-03 LAB — CBC WITH DIFFERENTIAL/PLATELET
Basophils Absolute: 0 10*3/uL (ref 0.0–0.2)
Basos: 0 %
EOS (ABSOLUTE): 0.1 10*3/uL (ref 0.0–0.4)
Eos: 3 %
Hematocrit: 43.1 % (ref 37.5–51.0)
Hemoglobin: 14.9 g/dL (ref 13.0–17.7)
Immature Grans (Abs): 0 10*3/uL (ref 0.0–0.1)
Immature Granulocytes: 1 %
Lymphocytes Absolute: 1.4 10*3/uL (ref 0.7–3.1)
Lymphs: 30 %
MCH: 29.6 pg (ref 26.6–33.0)
MCHC: 34.6 g/dL (ref 31.5–35.7)
MCV: 86 fL (ref 79–97)
Monocytes Absolute: 0.7 10*3/uL (ref 0.1–0.9)
Monocytes: 16 %
Neutrophils Absolute: 2.3 10*3/uL (ref 1.4–7.0)
Neutrophils: 50 %
Platelets: 174 10*3/uL (ref 150–379)
RBC: 5.03 x10E6/uL (ref 4.14–5.80)
RDW: 13 % (ref 12.3–15.4)
WBC: 4.6 10*3/uL (ref 3.4–10.8)

## 2017-07-03 LAB — PROTIME-INR
INR: 1 (ref 0.8–1.2)
Prothrombin Time: 10.8 s (ref 9.1–12.0)

## 2017-07-11 ENCOUNTER — Encounter: Payer: Self-pay | Admitting: Cardiology

## 2017-07-11 ENCOUNTER — Other Ambulatory Visit: Payer: 59 | Admitting: *Deleted

## 2017-07-11 DIAGNOSIS — E785 Hyperlipidemia, unspecified: Secondary | ICD-10-CM

## 2017-07-11 DIAGNOSIS — I10 Essential (primary) hypertension: Secondary | ICD-10-CM

## 2017-07-11 DIAGNOSIS — I25119 Atherosclerotic heart disease of native coronary artery with unspecified angina pectoris: Secondary | ICD-10-CM

## 2017-07-12 LAB — CBC WITH DIFFERENTIAL/PLATELET
Basophils Absolute: 0.1 10*3/uL (ref 0.0–0.2)
Basos: 1 %
EOS (ABSOLUTE): 0.2 10*3/uL (ref 0.0–0.4)
Eos: 3 %
Hematocrit: 44.4 % (ref 37.5–51.0)
Hemoglobin: 15.5 g/dL (ref 13.0–17.7)
Immature Grans (Abs): 0.1 10*3/uL (ref 0.0–0.1)
Immature Granulocytes: 1 %
Lymphocytes Absolute: 1.9 10*3/uL (ref 0.7–3.1)
Lymphs: 33 %
MCH: 29 pg (ref 26.6–33.0)
MCHC: 34.9 g/dL (ref 31.5–35.7)
MCV: 83 fL (ref 79–97)
Monocytes Absolute: 0.4 10*3/uL (ref 0.1–0.9)
Monocytes: 7 %
Neutrophils Absolute: 3.3 10*3/uL (ref 1.4–7.0)
Neutrophils: 55 %
Platelets: 200 10*3/uL (ref 150–379)
RBC: 5.35 x10E6/uL (ref 4.14–5.80)
RDW: 13 % (ref 12.3–15.4)
WBC: 5.9 10*3/uL (ref 3.4–10.8)

## 2017-07-12 LAB — LIPID PANEL
Chol/HDL Ratio: 3.1 ratio (ref 0.0–5.0)
Cholesterol, Total: 166 mg/dL (ref 100–199)
HDL: 53 mg/dL (ref >39)
LDL Calculated: 79 mg/dL (ref 0–99)
Triglycerides: 169 mg/dL — ABNORMAL HIGH (ref 0–149)
VLDL Cholesterol Cal: 34 mg/dL (ref 5–40)

## 2017-07-12 LAB — BASIC METABOLIC PANEL
BUN/Creatinine Ratio: 17 (ref 10–24)
BUN: 17 mg/dL (ref 8–27)
CO2: 26 mmol/L (ref 20–29)
Calcium: 9.8 mg/dL (ref 8.6–10.2)
Chloride: 99 mmol/L (ref 96–106)
Creatinine, Ser: 1.02 mg/dL (ref 0.76–1.27)
GFR calc Af Amer: 92 mL/min/{1.73_m2} (ref >59)
GFR calc non Af Amer: 80 mL/min/{1.73_m2} (ref >59)
Glucose: 117 mg/dL — ABNORMAL HIGH (ref 65–99)
Potassium: 4.9 mmol/L (ref 3.5–5.2)
Sodium: 138 mmol/L (ref 134–144)

## 2017-07-12 LAB — TSH: TSH: 1.01 u[IU]/mL (ref 0.450–4.500)

## 2017-07-12 LAB — HEPATIC FUNCTION PANEL
ALT: 25 IU/L (ref 0–44)
AST: 21 IU/L (ref 0–40)
Albumin: 4.7 g/dL (ref 3.6–4.8)
Alkaline Phosphatase: 49 IU/L (ref 39–117)
Bilirubin Total: 0.5 mg/dL (ref 0.0–1.2)
Bilirubin, Direct: 0.17 mg/dL (ref 0.00–0.40)
Total Protein: 7.2 g/dL (ref 6.0–8.5)

## 2017-07-12 LAB — CK: Total CK: 114 U/L (ref 24–204)

## 2017-07-12 LAB — MAGNESIUM: Magnesium: 2.2 mg/dL (ref 1.6–2.3)

## 2017-09-08 ENCOUNTER — Other Ambulatory Visit: Payer: Self-pay | Admitting: Cardiology

## 2017-09-08 DIAGNOSIS — R931 Abnormal findings on diagnostic imaging of heart and coronary circulation: Secondary | ICD-10-CM

## 2017-09-08 DIAGNOSIS — Z01812 Encounter for preprocedural laboratory examination: Secondary | ICD-10-CM

## 2017-09-08 DIAGNOSIS — R0609 Other forms of dyspnea: Secondary | ICD-10-CM

## 2017-09-08 DIAGNOSIS — R072 Precordial pain: Secondary | ICD-10-CM

## 2017-10-06 ENCOUNTER — Other Ambulatory Visit: Payer: Self-pay | Admitting: Cardiology

## 2018-03-06 ENCOUNTER — Other Ambulatory Visit: Payer: Self-pay | Admitting: Cardiology

## 2018-03-15 ENCOUNTER — Encounter: Payer: Self-pay | Admitting: Physician Assistant

## 2018-03-15 DIAGNOSIS — E785 Hyperlipidemia, unspecified: Secondary | ICD-10-CM

## 2018-03-20 NOTE — Telephone Encounter (Signed)
Order for lipids and cmet placed in for this pt, and will be done as he requested, prior to his follow-up appt with Herma Carson PA-C on 04/22/18.  Sent the pt a message through mychart to call me back or message me back with his preferred appt date.  Also messaged him that he will need to come fasting to this lab appt.

## 2018-04-13 ENCOUNTER — Other Ambulatory Visit: Payer: Self-pay | Admitting: Cardiology

## 2018-04-13 DIAGNOSIS — R0609 Other forms of dyspnea: Secondary | ICD-10-CM

## 2018-04-13 DIAGNOSIS — R931 Abnormal findings on diagnostic imaging of heart and coronary circulation: Secondary | ICD-10-CM

## 2018-04-13 DIAGNOSIS — R072 Precordial pain: Secondary | ICD-10-CM

## 2018-04-13 DIAGNOSIS — Z01812 Encounter for preprocedural laboratory examination: Secondary | ICD-10-CM

## 2018-04-21 DIAGNOSIS — I712 Thoracic aortic aneurysm, without rupture: Secondary | ICD-10-CM | POA: Insufficient documentation

## 2018-04-21 DIAGNOSIS — I7121 Aneurysm of the ascending aorta, without rupture: Secondary | ICD-10-CM | POA: Insufficient documentation

## 2018-04-21 DIAGNOSIS — I2510 Atherosclerotic heart disease of native coronary artery without angina pectoris: Secondary | ICD-10-CM | POA: Insufficient documentation

## 2018-04-21 NOTE — Progress Notes (Signed)
Cardiology Office Note    Date:  04/22/2018   ID:  Leonard Hawkins, DOB 1956/03/27, MRN [id number]  PCP:  Angelica Ran, MD  Cardiologist: Ena Dawley, MD  No chief complaint on file.   History of Present Illness:  Leonard Hawkins is a 62 y.o. male has a history of a high calcium score of the thousand and 67 which is 65 percentile for age and sex match control.  Coronary CTA showed two-vessel obstructive CAD.  He underwent cardiac catheterization by Dr. Tamala Julian 05/25/2017 had a 75% first diagonal, 40% proximal LAD, 25% mid LAD, 25% first OM1, with aggressive risk factor management.  Also has a sending aortic aneurysm up to 45 mm on CTA recommend follow-up in 6 months, hyperlipidemia on Crestor 10 mg.  Last labs 06/2017 LDL was 79 triglycerides 169.   Comes in today for f/u. Stopped ASA secondary to bleeding in bowels.  He is tried taking it with food and sucralfate but does not seem to help.  Stopped Crestor secondary to myalgias approximately 1 month ago.  BP high today but he attributes to stressful morning at work.  Blood pressures have been running 130/92 at home for the past year.  Probably getting too much salt.  Exercises about 3 days a week walking, yard work and Careers information officer.  Both his sister and brother have aneurysms about the same size as his.  Past Medical History:  Diagnosis Date  . Asthma   . Hyperlipidemia     Past Surgical History:  Procedure Laterality Date  . LEFT HEART CATH AND CORONARY ANGIOGRAPHY N/A 05/23/2017   Procedure: Left Heart Cath and Coronary Angiography;  Surgeon: Belva Crome, MD;  Location: [place] [facility];  Service: Cardiovascular;  Laterality: N/A;    Current Medications: Current Meds  Medication Sig  . esomeprazole (NEXIUM) 40 MG capsule Take 40 mg by mouth daily as needed (acid reflux).   . Omega-3 Fatty Acids ([employer] FISH OIL) 1000 MG CAPS TAKE 1 CAPSULE (1,000 MG TOTAL) BY MOUTH DAILY.  Vladimir Faster Glycol-Propyl Glycol (SYSTANE OP) Apply 1  drop to eye daily as needed (dry eyes).  . [DISCONTINUED] aspirin EC 81 MG tablet Take 1 tablet (81 mg total) by mouth daily.  . [DISCONTINUED] rosuvastatin (CRESTOR) 10 MG tablet TAKE 1 TABLET BY MOUTH EVERY DAY     Allergies:   Cashew nut oil; Polyethylene glycol; and Nsaids   Social History   Socioeconomic History  . Marital status: Married    Spouse name: Not on file  . Number of children: Not on file  . Years of education: Not on file  . Highest education level: Not on file  Occupational History  . Not on file  Social Needs  . Financial resource strain: Not on file  . Food insecurity:    Worry: Not on file    Inability: Not on file  . Transportation needs:    Medical: Not on file    Non-medical: Not on file  Tobacco Use  . Smoking status: Never Smoker  . Smokeless tobacco: Former Network engineer and Sexual Activity  . Alcohol use: Yes    Comment: social drinker  . Drug use: No  . Sexual activity: Yes    Birth control/protection: None  Lifestyle  . Physical activity:    Days per week: Not on file    Minutes per session: Not on file  . Stress: Not on file  Relationships  . Social connections:    Talks  on phone: Not on file    Gets together: Not on file    Attends religious service: Not on file    Active member of club or organization: Not on file    Attends meetings of clubs or organizations: Not on file    Relationship status: Not on file  Other Topics Concern  . Not on file  Social History Narrative  . Not on file     Family History:  The patient's family history includes AAA (abdominal aortic aneurysm) in his sister; Heart attack in his father; Heart disease in his father; Ovarian cancer in his mother.   ROS:   Please see the history of present illness.    Review of Systems  Constitution: Negative.  HENT: Negative.   Cardiovascular: Negative.   Respiratory: Negative.   Endocrine: Negative.   Musculoskeletal: Positive for back pain and myalgias.    Gastrointestinal: Positive for hematochezia.  Genitourinary: Negative.   Neurological: Negative.    All other systems reviewed and are negative.   PHYSICAL EXAM:   VS:  BP (!) 142/100 (BP Location: Right Arm, Patient Position: Sitting, Cuff Size: Large)   Pulse (!) 57   Ht 5' 11"  (1.803 m)   Wt 237 lb (107.5 kg)   SpO2 96%   BMI 33.05 kg/m   Physical Exam  GEN: Well nourished, well developed, in no acute distress  Neck: no JVD, carotid bruits, or masses Cardiac:RRR; 1/6 to 2/6 systolic murmur at the left sternal border Respiratory:  clear to auscultation bilaterally, normal work of breathing GI: soft, nontender, nondistended, + BS Ext: without cyanosis, clubbing, or edema, Good distal pulses bilaterally Neuro:  Alert and Oriented x 3 Psych: euthymic mood, full affect  Wt Readings from Last 3 Encounters:  04/22/18 237 lb (107.5 kg)  06/28/17 228 lb (103.4 kg)  05/23/17 227 lb (103 kg)      Studies/Labs Reviewed:   EKG:  EKG is  ordered today.  The ekg ordered today demonstrates sinus bradycardia at 57 bpm otherwise normal  Recent Labs: 07/11/2017: ALT 25; BUN 17; Creatinine, Ser 1.02; Hemoglobin 15.5; Magnesium 2.2; Platelets 200; Potassium 4.9; Sodium 138; TSH 1.010   Lipid Panel    Component Value Date/Time   CHOL 166 07/11/2017 0845   TRIG 169 (H) 07/11/2017 0845   HDL 53 07/11/2017 0845   CHOLHDL 3.1 07/11/2017 0845   LDLCALC 79 07/11/2017 0845    Additional studies/ records that were reviewed today include:  CT cardiac scoring 6/2018Ascending Aorta: Moderately dilated measuring 46 mm on the axial images.   Pericardium: Normal.   Coronary arteries:  Normal origin.   IMPRESSION: 1. Coronary calcium score of 1086. This was 70 percentile for age and sex matched control.   2. There is moderate aneurysmal dilatation of the ascending aorta measuring 46 mm on the axial images. A CTA is recommended for further evaluation.   Ena Dawley 1. LM:  Normal  FFR 2. LAD:  Proximal 0.86, mid 0.81, distal 0.77. 3. D1:  Proximal to mid:  0.64. 4. LCX:  0.91. 5. RCA:  Proximal 0.96, distal 0.78.   IMPRESSION: 1. 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 RCA is small and non-dominant.   A left cardiac catheterization is recommended.   Ena Dawley  Cardiac catheterization 7/2018Conclusion    Left dominant coronary anatomy.  75% first diagonal obstruction.  40% proximal LAD and 25% mid LAD obstruction.  25% first obtuse marginal  Tandem 40  and 20% RCA stenoses.  Normal left ventricular function with EF 55%. Normal hemodynamics.   RECOMMENDATIONS:    Aggressive risk factor modification      ASSESSMENT:    1. High coronary artery calcium score   2. Coronary artery disease involving native coronary artery of native heart without angina pectoris   3. Hyperlipidemia, unspecified hyperlipidemia type   4. Ascending aortic aneurysm ([place])   5. Thoracic aortic aneurysm without rupture ([place])   6. Essential hypertension      PLAN:  In order of problems listed above:  High coronary calcium score over 1000 04/2017  CAD nonobstructive on cardiac catheterization 04/2017 recommend aggressive risk factor modification-needs tighter blood pressure control and lipid management.  Hyperlipidemia myalgias on Crestor.  Will refer to lipid clinic to consider PCSK9 inhibitor.  Asending aortic aneurysm 45 mm on CT will order repeat CT  Essential hypertension blood pressures been up over a year according to his readings.  Will begin low-dose lisinopril 5 mg daily.  2 g sodium diet.  Can follow-up blood pressure in the lipid clinic.  Will check labs today and repeat be met when he comes back to the lipid clinic.  Follow-up with Dr. Meda Coffee in 6 months.    Medication Adjustments/Labs and Tests Ordered: Current medicines are reviewed at length with the patient today.  Concerns regarding medicines are  outlined above.  Medication changes, Labs and Tests ordered today are listed in the Patient Instructions below. Patient Instructions  Medication Instructions:  Your physician has recommended you make the following change in your medication:   1. START: lisinopril 5 mg tablet: Take 1 tablet once a day  Labwork: Keep your lab appointment today for CMET and LIPIDS  Testing/Procedures: Your physician recommends that you have a Chest CT to evaluate your thoracic aortic aneurysm  Follow-Up: Your physician recommends that you schedule a follow-up appointment in the Hypertension Clinic/Cholesterol Clinic in 1-2 weeks for Blood Pressure Management and discuss options for cholesterol control. You will need a repeat lab draw (BMET) at this appointment.  Your physician recommends that you schedule a follow-up appointment in: 6 months with Dr. Meda Coffee.  Any Other Special Instructions Will Be Listed Below (If Applicable).   Low-Sodium Eating Plan Sodium, which is an element that makes up salt, helps you maintain a healthy balance of fluids in your body. Too much sodium can increase your blood pressure and cause fluid and waste to be held in your body. Your health care provider or dietitian may recommend following this plan if you have high blood pressure (hypertension), kidney disease, liver disease, or heart failure. Eating less sodium can help lower your blood pressure, reduce swelling, and protect your heart, liver, and kidneys. What are tips for following this plan? General guidelines  Most people on this plan should limit their sodium intake to 1,500-2,000 mg (milligrams) of sodium each day. Reading food labels  The Nutrition Facts label lists the amount of sodium in one serving of the food. If you eat more than one serving, you must multiply the listed amount of sodium by the number of servings.  Choose foods with less than 140 mg of sodium per serving.  Avoid foods with 300 mg of sodium or  more per serving. Shopping  Look for lower-sodium products, often labeled as "low-sodium" or "no salt added."  Always check the sodium content even if foods are labeled as "unsalted" or "no salt added".  Buy fresh foods. ? Avoid canned foods and premade or  frozen meals. ? Avoid canned, cured, or processed meats  Buy breads that have less than 80 mg of sodium per slice. Cooking  Eat more home-cooked food and less restaurant, buffet, and fast food.  Avoid adding salt when cooking. Use salt-free seasonings or herbs instead of table salt or sea salt. Check with your health care provider or pharmacist before using salt substitutes.  Cook with plant-based oils, such as canola, sunflower, or olive oil. Meal planning  When eating at a restaurant, ask that your food be prepared with less salt or no salt, if possible.  Avoid foods that contain MSG (monosodium glutamate). MSG is sometimes added to [place] food, bouillon, and some canned foods. What foods are recommended? The items listed may not be a complete list. Talk with your dietitian about what dietary choices are best for you. Grains Low-sodium cereals, including oats, puffed wheat and rice, and shredded wheat. Low-sodium crackers. Unsalted rice. Unsalted pasta. Low-sodium bread. Whole-grain breads and whole-grain pasta. Vegetables Fresh or frozen vegetables. "No salt added" canned vegetables. "No salt added" tomato sauce and paste. Low-sodium or reduced-sodium tomato and vegetable juice. Fruits Fresh, frozen, or canned fruit. Fruit juice. Meats and other protein foods Fresh or frozen (no salt added) meat, poultry, seafood, and fish. Low-sodium canned tuna and salmon. Unsalted nuts. Dried peas, beans, and lentils without added salt. Unsalted canned beans. Eggs. Unsalted nut butters. Dairy Milk. Soy milk. Cheese that is naturally low in sodium, such as ricotta cheese, fresh mozzarella, or Swiss cheese Low-sodium or reduced-sodium  cheese. Cream cheese. Yogurt. Fats and oils Unsalted butter. Unsalted margarine with no trans fat. Vegetable oils such as canola or olive oils. Seasonings and other foods Fresh and dried herbs and spices. Salt-free seasonings. Low-sodium mustard and ketchup. Sodium-free salad dressing. Sodium-free light mayonnaise. Fresh or refrigerated horseradish. Lemon juice. Vinegar. Homemade, reduced-sodium, or low-sodium soups. Unsalted popcorn and pretzels. Low-salt or salt-free chips. What foods are not recommended? The items listed may not be a complete list. Talk with your dietitian about what dietary choices are best for you. Grains Instant hot cereals. Bread stuffing, pancake, and biscuit mixes. Croutons. Seasoned rice or pasta mixes. Noodle soup cups. Boxed or frozen macaroni and cheese. Regular salted crackers. Self-rising flour. Vegetables Sauerkraut, pickled vegetables, and relishes. Olives. [place] fries. Onion rings. Regular canned vegetables (not low-sodium or reduced-sodium). Regular canned tomato sauce and paste (not low-sodium or reduced-sodium). Regular tomato and vegetable juice (not low-sodium or reduced-sodium). Frozen vegetables in sauces. Meats and other protein foods Meat or fish that is salted, canned, smoked, spiced, or pickled. Bacon, ham, sausage, hotdogs, corned beef, chipped beef, packaged lunch meats, salt pork, jerky, pickled herring, anchovies, regular canned tuna, sardines, salted nuts. Dairy Processed cheese and cheese spreads. Cheese curds. Blue cheese. Feta cheese. String cheese. Regular cottage cheese. Buttermilk. Canned milk. Fats and oils Salted butter. Regular margarine. Ghee. Bacon fat. Seasonings and other foods Onion salt, garlic salt, seasoned salt, table salt, and sea salt. Canned and packaged gravies. Worcestershire sauce. Tartar sauce. Barbecue sauce. Teriyaki sauce. Soy sauce, including reduced-sodium. Steak sauce. Fish sauce. Oyster sauce. Cocktail sauce.  Horseradish that you find on the shelf. Regular ketchup and mustard. Meat flavorings and tenderizers. Bouillon cubes. Hot sauce and Tabasco sauce. Premade or packaged marinades. Premade or packaged taco seasonings. Relishes. Regular salad dressings. Salsa. Potato and tortilla chips. Corn chips and puffs. Salted popcorn and pretzels. Canned or dried soups. Pizza. Frozen entrees and pot pies. Summary  Eating less sodium can help lower  your blood pressure, reduce swelling, and protect your heart, liver, and kidneys.  Most people on this plan should limit their sodium intake to 1,500-2,000 mg (milligrams) of sodium each day.  Canned, boxed, and frozen foods are high in sodium. Restaurant foods, fast foods, and pizza are also very high in sodium. You also get sodium by adding salt to food.  Try to cook at home, eat more fresh fruits and vegetables, and eat less fast food, canned, processed, or prepared foods. This information is not intended to replace advice given to you by your health care provider. Make sure you discuss any questions you have with your health care provider. Document Released: 04/06/2002 Document Revised: 10/08/2016 Document Reviewed: 10/08/2016 Elsevier Interactive Patient Education  [employer].    If you need a refill on your cardiac medications before your next appointment, please call your pharmacy.       Sumner Boast, PA-C  04/22/2018 9:26 AM    [facility] [address], [place], [place]  [address] Phone: [phone]; Fax: [phone]

## 2018-04-22 ENCOUNTER — Other Ambulatory Visit: Payer: Commercial Managed Care - PPO

## 2018-04-22 ENCOUNTER — Encounter: Payer: Self-pay | Admitting: Physician Assistant

## 2018-04-22 ENCOUNTER — Ambulatory Visit (INDEPENDENT_AMBULATORY_CARE_PROVIDER_SITE_OTHER): Payer: Commercial Managed Care - PPO | Admitting: Physician Assistant

## 2018-04-22 VITALS — BP 142/100 | HR 57 | Ht 71.0 in | Wt 237.0 lb

## 2018-04-22 DIAGNOSIS — E785 Hyperlipidemia, unspecified: Secondary | ICD-10-CM

## 2018-04-22 DIAGNOSIS — I7121 Aneurysm of the ascending aorta, without rupture: Secondary | ICD-10-CM

## 2018-04-22 DIAGNOSIS — I2510 Atherosclerotic heart disease of native coronary artery without angina pectoris: Secondary | ICD-10-CM

## 2018-04-22 DIAGNOSIS — I10 Essential (primary) hypertension: Secondary | ICD-10-CM | POA: Diagnosis not present

## 2018-04-22 DIAGNOSIS — R931 Abnormal findings on diagnostic imaging of heart and coronary circulation: Secondary | ICD-10-CM

## 2018-04-22 DIAGNOSIS — I712 Thoracic aortic aneurysm, without rupture, unspecified: Secondary | ICD-10-CM

## 2018-04-22 LAB — COMPREHENSIVE METABOLIC PANEL
ALT: 29 IU/L (ref 0–44)
AST: 18 IU/L (ref 0–40)
Albumin/Globulin Ratio: 2 (ref 1.2–2.2)
Albumin: 4.7 g/dL (ref 3.6–4.8)
Alkaline Phosphatase: 51 IU/L (ref 39–117)
BUN/Creatinine Ratio: 10 (ref 10–24)
BUN: 11 mg/dL (ref 8–27)
Bilirubin Total: 0.5 mg/dL (ref 0.0–1.2)
CO2: 25 mmol/L (ref 20–29)
Calcium: 9.7 mg/dL (ref 8.6–10.2)
Chloride: 98 mmol/L (ref 96–106)
Creatinine, Ser: 1.1 mg/dL (ref 0.76–1.27)
GFR calc Af Amer: 83 mL/min/{1.73_m2} (ref >59)
GFR calc non Af Amer: 72 mL/min/{1.73_m2} (ref >59)
Globulin, Total: 2.4 g/dL (ref 1.5–4.5)
Glucose: 113 mg/dL — ABNORMAL HIGH (ref 65–99)
Potassium: 4.6 mmol/L (ref 3.5–5.2)
Sodium: 138 mmol/L (ref 134–144)
Total Protein: 7.1 g/dL (ref 6.0–8.5)

## 2018-04-22 LAB — LIPID PANEL
Chol/HDL Ratio: 4.5 ratio (ref 0.0–5.0)
Cholesterol, Total: 234 mg/dL — ABNORMAL HIGH (ref 100–199)
HDL: 52 mg/dL (ref >39)
LDL Calculated: 138 mg/dL — ABNORMAL HIGH (ref 0–99)
Triglycerides: 220 mg/dL — ABNORMAL HIGH (ref 0–149)
VLDL Cholesterol Cal: 44 mg/dL — ABNORMAL HIGH (ref 5–40)

## 2018-04-22 MED ORDER — LISINOPRIL 5 MG PO TABS: 5.0000 mg | ORAL_TABLET | Freq: Every day | ORAL | 3 refills | Status: DC

## 2018-04-22 NOTE — Patient Instructions (Signed)
Medication Instructions:  Your physician has recommended you make the following change in your medication:   1. START: lisinopril 5 mg tablet: Take 1 tablet once a day  Labwork: Keep your lab appointment today for CMET and LIPIDS  Testing/Procedures: Your physician recommends that you have a Chest CT to evaluate your thoracic aortic aneurysm  Follow-Up: Your physician recommends that you schedule a follow-up appointment in the Hypertension Clinic/Cholesterol Clinic in 1-2 weeks for Blood Pressure Management and discuss options for cholesterol control. You will need a repeat lab draw (BMET) at this appointment.  Your physician recommends that you schedule a follow-up appointment in: 6 months with Dr. Delton See.  Any Other Special Instructions Will Be Listed Below (If Applicable).   Low-Sodium Eating Plan Sodium, which is an element that makes up salt, helps you maintain a healthy balance of fluids in your body. Too much sodium can increase your blood pressure and cause fluid and waste to be held in your body. Your health care provider or dietitian may recommend following this plan if you have high blood pressure (hypertension), kidney disease, liver disease, or heart failure. Eating less sodium can help lower your blood pressure, reduce swelling, and protect your heart, liver, and kidneys. What are tips for following this plan? General guidelines  Most people on this plan should limit their sodium intake to 1,500-2,000 mg (milligrams) of sodium each day. Reading food labels  The Nutrition Facts label lists the amount of sodium in one serving of the food. If you eat more than one serving, you must multiply the listed amount of sodium by the number of servings.  Choose foods with less than 140 mg of sodium per serving.  Avoid foods with 300 mg of sodium or more per serving. Shopping  Look for lower-sodium products, often labeled as "low-sodium" or "no salt added."  Always check the  sodium content even if foods are labeled as "unsalted" or "no salt added".  Buy fresh foods. ? Avoid canned foods and premade or frozen meals. ? Avoid canned, cured, or processed meats  Buy breads that have less than 80 mg of sodium per slice. Cooking  Eat more home-cooked food and less restaurant, buffet, and fast food.  Avoid adding salt when cooking. Use salt-free seasonings or herbs instead of table salt or sea salt. Check with your health care provider or pharmacist before using salt substitutes.  Cook with plant-based oils, such as canola, sunflower, or olive oil. Meal planning  When eating at a restaurant, ask that your food be prepared with less salt or no salt, if possible.  Avoid foods that contain MSG (monosodium glutamate). MSG is sometimes added to [place] food, bouillon, and some canned foods. What foods are recommended? The items listed may not be a complete list. Talk with your dietitian about what dietary choices are best for you. Grains Low-sodium cereals, including oats, puffed wheat and rice, and shredded wheat. Low-sodium crackers. Unsalted rice. Unsalted pasta. Low-sodium bread. Whole-grain breads and whole-grain pasta. Vegetables Fresh or frozen vegetables. "No salt added" canned vegetables. "No salt added" tomato sauce and paste. Low-sodium or reduced-sodium tomato and vegetable juice. Fruits Fresh, frozen, or canned fruit. Fruit juice. Meats and other protein foods Fresh or frozen (no salt added) meat, poultry, seafood, and fish. Low-sodium canned tuna and salmon. Unsalted nuts. Dried peas, beans, and lentils without added salt. Unsalted canned beans. Eggs. Unsalted nut butters. Dairy Milk. Soy milk. Cheese that is naturally low in sodium, such as ricotta cheese, fresh  mozzarella, or Swiss cheese Low-sodium or reduced-sodium cheese. Cream cheese. Yogurt. Fats and oils Unsalted butter. Unsalted margarine with no trans fat. Vegetable oils such as canola or olive  oils. Seasonings and other foods Fresh and dried herbs and spices. Salt-free seasonings. Low-sodium mustard and ketchup. Sodium-free salad dressing. Sodium-free light mayonnaise. Fresh or refrigerated horseradish. Lemon juice. Vinegar. Homemade, reduced-sodium, or low-sodium soups. Unsalted popcorn and pretzels. Low-salt or salt-free chips. What foods are not recommended? The items listed may not be a complete list. Talk with your dietitian about what dietary choices are best for you. Grains Instant hot cereals. Bread stuffing, pancake, and biscuit mixes. Croutons. Seasoned rice or pasta mixes. Noodle soup cups. Boxed or frozen macaroni and cheese. Regular salted crackers. Self-rising flour. Vegetables Sauerkraut, pickled vegetables, and relishes. Olives. [place] fries. Onion rings. Regular canned vegetables (not low-sodium or reduced-sodium). Regular canned tomato sauce and paste (not low-sodium or reduced-sodium). Regular tomato and vegetable juice (not low-sodium or reduced-sodium). Frozen vegetables in sauces. Meats and other protein foods Meat or fish that is salted, canned, smoked, spiced, or pickled. Bacon, ham, sausage, hotdogs, corned beef, chipped beef, packaged lunch meats, salt pork, jerky, pickled herring, anchovies, regular canned tuna, sardines, salted nuts. Dairy Processed cheese and cheese spreads. Cheese curds. Blue cheese. Feta cheese. String cheese. Regular cottage cheese. Buttermilk. Canned milk. Fats and oils Salted butter. Regular margarine. Ghee. Bacon fat. Seasonings and other foods Onion salt, garlic salt, seasoned salt, table salt, and sea salt. Canned and packaged gravies. Worcestershire sauce. Tartar sauce. Barbecue sauce. Teriyaki sauce. Soy sauce, including reduced-sodium. Steak sauce. Fish sauce. Oyster sauce. Cocktail sauce. Horseradish that you find on the shelf. Regular ketchup and mustard. Meat flavorings and tenderizers. Bouillon cubes. Hot sauce and Tabasco sauce.  Premade or packaged marinades. Premade or packaged taco seasonings. Relishes. Regular salad dressings. Salsa. Potato and tortilla chips. Corn chips and puffs. Salted popcorn and pretzels. Canned or dried soups. Pizza. Frozen entrees and pot pies. Summary  Eating less sodium can help lower your blood pressure, reduce swelling, and protect your heart, liver, and kidneys.  Most people on this plan should limit their sodium intake to 1,500-2,000 mg (milligrams) of sodium each day.  Canned, boxed, and frozen foods are high in sodium. Restaurant foods, fast foods, and pizza are also very high in sodium. You also get sodium by adding salt to food.  Try to cook at home, eat more fresh fruits and vegetables, and eat less fast food, canned, processed, or prepared foods. This information is not intended to replace advice given to you by your health care provider. Make sure you discuss any questions you have with your health care provider. Document Released: 04/06/2002 Document Revised: 10/08/2016 Document Reviewed: 10/08/2016 Elsevier Interactive Patient Education  [employer]2018 Elsevier Inc.    If you need a refill on your cardiac medications before your next appointment, please call your pharmacy.

## 2018-05-08 ENCOUNTER — Encounter: Payer: Self-pay | Admitting: Physician Assistant

## 2018-05-15 ENCOUNTER — Encounter: Payer: Self-pay | Admitting: Pharmacist

## 2018-05-15 ENCOUNTER — Ambulatory Visit (INDEPENDENT_AMBULATORY_CARE_PROVIDER_SITE_OTHER): Payer: Commercial Managed Care - PPO | Admitting: Pharmacist

## 2018-05-15 ENCOUNTER — Ambulatory Visit (INDEPENDENT_AMBULATORY_CARE_PROVIDER_SITE_OTHER)
Admission: RE | Admit: 2018-05-15 | Discharge: 2018-05-15 | Disposition: A | Discharge status: Home / Self Care | Payer: Commercial Managed Care - PPO | Source: Ambulatory Visit | Attending: Physician Assistant | Admitting: Physician Assistant

## 2018-05-15 VITALS — BP 108/70 | HR 58

## 2018-05-15 DIAGNOSIS — I10 Essential (primary) hypertension: Secondary | ICD-10-CM

## 2018-05-15 DIAGNOSIS — E785 Hyperlipidemia, unspecified: Secondary | ICD-10-CM | POA: Diagnosis not present

## 2018-05-15 DIAGNOSIS — I712 Thoracic aortic aneurysm, without rupture, unspecified: Secondary | ICD-10-CM

## 2018-05-15 LAB — BASIC METABOLIC PANEL
BUN/Creatinine Ratio: 14 (ref 10–24)
BUN: 15 mg/dL (ref 8–27)
CO2: 22 mmol/L (ref 20–29)
Calcium: 9 mg/dL (ref 8.6–10.2)
Chloride: 98 mmol/L (ref 96–106)
Creatinine, Ser: 1.1 mg/dL (ref 0.76–1.27)
GFR calc Af Amer: 83 mL/min/{1.73_m2} (ref >59)
GFR calc non Af Amer: 72 mL/min/{1.73_m2} (ref >59)
Glucose: 98 mg/dL (ref 65–99)
Potassium: 4.8 mmol/L (ref 3.5–5.2)
Sodium: 137 mmol/L (ref 134–144)

## 2018-05-15 MED ORDER — IOPAMIDOL (ISOVUE-370) INJECTION 76%
100.0000 mL | Freq: Once | INTRAVENOUS | Status: AC | PRN
  Administered 2018-05-15: 100 mL via INTRAVENOUS

## 2018-05-15 NOTE — Patient Instructions (Addendum)
Blood work today - BMET  CONTINUE lisinopril 5mg  daily   STOP rosuvastatin due to muscle aches.   WE will send for coverage of Repatha or Praluent through your insurance. Once you start the medication please call the clinic so that we can order labs. [phone].    Cholesterol Cholesterol is a fat. Your body needs a small amount of cholesterol. Cholesterol (plaque) may build up in your blood vessels (arteries). That makes you more likely to have a heart attack or stroke. You cannot feel your cholesterol level. Having a blood test is the only way to find out if your level is high. Keep your test results. Work with your doctor to keep your cholesterol at a good level. What do the results mean?  Total cholesterol is how much cholesterol is in your blood.  LDL is bad cholesterol. This is the type that can build up. Try to have low LDL.  HDL is good cholesterol. It cleans your blood vessels and carries LDL away. Try to have high HDL.  Triglycerides are fat that the body can store or burn for energy. What are good levels of cholesterol?  Total cholesterol below 200.  LDL below 100 is good for people who have health risks. LDL below 70 is good for people who have very high risks.  HDL above 40 is good. It is best to have HDL of 60 or higher.  Triglycerides below 150. How can I lower my cholesterol? Diet Follow your diet program as told by your doctor.  Choose fish, white meat chicken, or [place] that is roasted or baked. Try not to eat red meat, fried foods, sausage, or lunch meats.  Eat lots of fresh fruits and vegetables.  Choose whole grains, beans, pasta, potatoes, and cereals.  Choose olive oil, corn oil, or canola oil. Only use small amounts.  Try not to eat butter, mayonnaise, shortening, or palm kernel oils.  Try not to eat foods with trans fats.  Choose low-fat or nonfat dairy foods. ? Drink skim or nonfat milk. ? Eat low-fat or nonfat yogurt and cheeses. ? Try not  to drink whole milk or cream. ? Try not to eat ice cream, egg yolks, or full-fat cheeses.  Healthy desserts include angel food cake, ginger snaps, animal crackers, hard candy, popsicles, and low-fat or nonfat frozen yogurt. Try not to eat pastries, cakes, pies, and cookies.  Exercise Follow your exercise program as told by your doctor.  Be more active. Try gardening, walking, and taking the stairs.  Ask your doctor about ways that you can be more active.  Medicine  Take over-the-counter and prescription medicines only as told by your doctor. This information is not intended to replace advice given to you by your health care provider. Make sure you discuss any questions you have with your health care provider. Document Released: 01/11/2009 Document Revised: 05/16/2016 Document Reviewed: 04/26/2016 Elsevier Interactive Patient Education  [employer]2018 Elsevier Inc.

## 2018-05-15 NOTE — Progress Notes (Signed)
Patient ID: Leonard Hawkins                 DOB: 1956/04/16                      MRN: [id number]     HPI: Leonard Hawkins is a 62 y.o. male patient of Dr. Delton See who presents today for hypertension evaluation/lipid management. PMH significant for high calcium score of the thousand and 86 which is 97 percentile for age and sex match control.  Coronary CTA showed two-vessel obstructive CAD.  He underwent cardiac catheterization by Dr. Katrinka Blazing 05/25/2017 had a 75% first diagonal, 40% proximal LAD, 25% mid LAD, 25% first OM1, with aggressive risk factor management. He was recently started on lisinopril for blood pressure. He has had trouble tolerating statin medications due to muscle pains.   He presents for blood pressure check and cholesterol discussion. He stopped lisinopril for about a week due to diarrhea. Rstarted about 10 days ago and diarrhea has improved. He has improved his diet significantly and plans to continue to improve. He does get dizzy upon standing, but he reports this is getting better.   He reports that he stopped rosuvastatin a while back due to muscle pains in his lower back. The pain improved while off the medication. He states that he recently restarted rosuvastatin and the muscle aching has returned. He reports that the same thing happened when he tried Zocor in the past.   Current HTN meds: lisinopril 5mg  daily   Current Lipid Meds: Fish oil 1g daily  Previously tried: crestor 10mg  daily (muscle pain in back), Zocor (muscle pain)  BP goal: <130/80  Family History: Mom - passed at 9 did have cholesterol, dad with bypass in 26s and BP and cholesterol, brothers with high cholesterol and similar CV history.   Social History: Denies tobacco product, 2-3 glasses occasionally. Works in [employer].   Diet: Eats out and from home. He was eating a lot of fast food, but has been doing better more recently. Has lost 5 lbs. He has been monitoring sodium. 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 Uses  butter and oil. Drinks mostly water and iced tea (half and half).   Exercise: He has been walking a mile every morning on hills. Has been in yard a lot more.   Home BP readings: 110/77 - average per report.   Labs:  04/22/18: TC 234, TG 220, HDL 52, LDL 138 (rosuvastatin 10mg  daily)  Wt Readings from Last 3 Encounters:  04/22/18 237 lb (107.5 kg)  06/28/17 228 lb (103.4 kg)  05/23/17 227 lb (103 kg)   BP Readings from Last 3 Encounters:  05/15/18 108/70  04/22/18 (!) 142/100  06/28/17 118/86   Pulse Readings from Last 3 Encounters:  05/15/18 (!) 58  04/22/18 (!) 57  06/28/17 72    Renal function: CrCl cannot be calculated (Patient's most recent lab result is older than the maximum 21 days allowed.).  Past Medical History:  Diagnosis Date  . Asthma   . Hyperlipidemia     Current Outpatient Medications on File Prior to Visit  Medication Sig Dispense Refill  . esomeprazole (NEXIUM) 40 MG capsule Take 40 mg by mouth daily as needed (acid reflux).     Marland Kitchen lisinopril (PRINIVIL,ZESTRIL) 5 MG tablet Take 1 tablet (5 mg total) by mouth daily. 90 tablet 3  . Omega-3 Fatty Acids ([employer] FISH OIL) 1000 MG CAPS TAKE 1 CAPSULE (1,000 MG TOTAL) BY MOUTH DAILY. 30  capsule 2  . Polyethyl Glycol-Propyl Glycol (SYSTANE OP) Apply 1 drop to eye daily as needed (dry eyes).     No current facility-administered medications on file prior to visit.     Allergies  Allergen Reactions  . Cashew Nut Oil Itching    Lip swelling  Lip swelling  Lip swelling   . Polyethylene Glycol Hives  . Nsaids Other (See Comments)    Blood pressure 108/70, pulse (!) 58. Seated : 118/82  Assessment/Plan: Hypertension: BMET today.  BP is borderline at goal. He pressure does drop upon standing. Will continue lisinopril 5mg  daily. Advised to continue to monitor dizziness and call if worsening.   Hyperlipidemia: LDL is not at goal < 70 given elevated calcium score/obstructive CAD. Will stop rosuvastatin due to  muscle pains. Will send for coverage of PSCK9i therapy through insurance. Discussed risk/benefit, injection technique, and cost obligation of PCSK9i therapy. Patient is comfortable with injection and would like to have mailed to home. Will notify him once approved through insurance. He is aware to call once started so that labs can be ordered.    Thank you, Freddie ApleyKelley M. Cleatis PolkaAuten, PharmD  [facility]  05/15/2018 10:18 AM  ADDENDUM: BMET stable - continue as above.

## 2018-05-21 ENCOUNTER — Telehealth: Payer: Self-pay | Admitting: Pharmacist

## 2018-05-21 MED ORDER — EVOLOCUMAB 140 MG/ML ~~LOC~~ SOAJ: 1.0000 "pen " | SUBCUTANEOUS | 11 refills | Status: DC

## 2018-05-21 NOTE — Telephone Encounter (Signed)
Prior authorization approved for Repatha. Rx sent to specialty pharmacy, pt qualifies for $5 copay card. LMOM for patient.

## 2018-05-22 MED ORDER — EVOLOCUMAB 140 MG/ML ~~LOC~~ SOAJ: 1.0000 "pen " | SUBCUTANEOUS | 11 refills | Status: DC

## 2018-05-22 NOTE — Telephone Encounter (Addendum)
[facility] unable to fill rx, insurance mandates a "cost exceeds maximum prior authorization" because his insurance only covers the more expensive NDC for Repatha.

## 2018-05-22 NOTE — Addendum Note (Signed)
Addended by: SUPPLE, MEGAN E on: 05/22/2018 01:36 PM   Modules accepted: Orders

## 2018-05-28 ENCOUNTER — Encounter: Payer: Self-pay | Admitting: Physician Assistant

## 2018-06-11 ENCOUNTER — Telehealth: Payer: Self-pay | Admitting: Pharmacist

## 2018-06-11 DIAGNOSIS — E782 Mixed hyperlipidemia: Secondary | ICD-10-CM

## 2018-06-11 NOTE — Addendum Note (Signed)
Addended by: Rainey Rodger E on: 06/11/2018 04:14 PM   Modules accepted: Orders

## 2018-06-11 NOTE — Telephone Encounter (Signed)
Left message for patient to see if he started PCSK9i therapy. Needs to schedule follow-up labs if he calls back.

## 2018-06-11 NOTE — Telephone Encounter (Signed)
Pt returned call to clinic, reports administering his first injection last week. Scheduled f/u lab work after 3 injections to assess efficacy.

## 2018-06-18 ENCOUNTER — Telehealth: Payer: Self-pay

## 2018-06-18 NOTE — Telephone Encounter (Signed)
Contacted patient in regards to his patient message about his Repatha auto injector. He reports clear liquid ran down his leg after his dose. Since it is unknown how much of his dose he received, continue current schedule with next dose 07/01/18. Rescheduled labs to after his 4th dose.  Amanda PeaAnna Oryon Gary, Ilda BassetPharm D PGY1 Pharmacy Resident  Phone [phone] 06/18/2018      10:05 AM

## 2018-07-07 ENCOUNTER — Telehealth: Payer: Self-pay | Admitting: Pharmacist

## 2018-07-07 NOTE — Telephone Encounter (Signed)
Called pt back re: MyChart message from today to advise him labs are currently scheduled for 9/24. He will have had 3 injections by then. No further questions.

## 2018-07-08 ENCOUNTER — Other Ambulatory Visit: Payer: Commercial Managed Care - PPO

## 2018-07-22 ENCOUNTER — Other Ambulatory Visit: Payer: Commercial Managed Care - PPO

## 2018-07-22 DIAGNOSIS — E782 Mixed hyperlipidemia: Secondary | ICD-10-CM

## 2018-07-22 LAB — LIPID PANEL
Chol/HDL Ratio: 2.8 ratio (ref 0.0–5.0)
Cholesterol, Total: 131 mg/dL (ref 100–199)
HDL: 47 mg/dL (ref >39)
LDL Calculated: 53 mg/dL (ref 0–99)
Triglycerides: 157 mg/dL — ABNORMAL HIGH (ref 0–149)
VLDL Cholesterol Cal: 31 mg/dL (ref 5–40)

## 2018-07-22 LAB — HEPATIC FUNCTION PANEL
ALT: 29 IU/L (ref 0–44)
AST: 22 IU/L (ref 0–40)
Albumin: 4.5 g/dL (ref 3.6–4.8)
Alkaline Phosphatase: 46 IU/L (ref 39–117)
Bilirubin Total: 0.6 mg/dL (ref 0.0–1.2)
Bilirubin, Direct: 0.2 mg/dL (ref 0.00–0.40)
Total Protein: 6.7 g/dL (ref 6.0–8.5)

## 2018-09-14 ENCOUNTER — Other Ambulatory Visit: Payer: Self-pay | Admitting: Cardiology

## 2018-09-14 DIAGNOSIS — R072 Precordial pain: Secondary | ICD-10-CM

## 2018-09-14 DIAGNOSIS — R931 Abnormal findings on diagnostic imaging of heart and coronary circulation: Secondary | ICD-10-CM

## 2018-09-14 DIAGNOSIS — Z01812 Encounter for preprocedural laboratory examination: Secondary | ICD-10-CM

## 2018-09-14 DIAGNOSIS — R0609 Other forms of dyspnea: Secondary | ICD-10-CM

## 2018-10-13 ENCOUNTER — Ambulatory Visit (INDEPENDENT_AMBULATORY_CARE_PROVIDER_SITE_OTHER): Payer: Commercial Managed Care - PPO | Admitting: Cardiology

## 2018-10-13 ENCOUNTER — Encounter: Payer: Self-pay | Admitting: Cardiology

## 2018-10-13 VITALS — BP 132/100 | HR 60 | Ht 71.0 in | Wt 231.2 lb

## 2018-10-13 DIAGNOSIS — I10 Essential (primary) hypertension: Secondary | ICD-10-CM | POA: Diagnosis not present

## 2018-10-13 DIAGNOSIS — I712 Thoracic aortic aneurysm, without rupture: Secondary | ICD-10-CM

## 2018-10-13 DIAGNOSIS — I2510 Atherosclerotic heart disease of native coronary artery without angina pectoris: Secondary | ICD-10-CM | POA: Diagnosis not present

## 2018-10-13 DIAGNOSIS — E782 Mixed hyperlipidemia: Secondary | ICD-10-CM

## 2018-10-13 DIAGNOSIS — I7121 Aneurysm of the ascending aorta, without rupture: Secondary | ICD-10-CM

## 2018-10-13 MED ORDER — LOSARTAN POTASSIUM 25 MG PO TABS: 25.0000 mg | ORAL_TABLET | Freq: Every day | ORAL | 2 refills | Status: DC

## 2018-10-13 NOTE — Progress Notes (Signed)
Cardiology Office Note:    Date:  10/13/2018   ID:  Leonard Hawkins, DOB 1956-01-18, MRN [id number]  PCP:  Garnetta Buddy, MD  Cardiologist:  Tobias Alexander, MD    Referring MD: Dr Charlies Constable  Chief complain: Chest pain  History of Present Illness:    Leonard Hawkins is a 62 y.o. male with a hx of Premature coronary artery disease in his father, cardiomegaly in his brother, hyperlipidemia who has been experiencing exertional dyspnea on exertion associated with dizziness and diaphoresis and retrosternal chest pressure. He has had several episodes in the last week including when he walk with his family and he couldn't finish the walk as he felt profoundly short of breath. He was tested by his primary care physician prolapse recently and was found to have elevated LDL for which she was started on Zocor and stopped taking it as he developed significant myalgias. He denies any prior syncope. No palpitations or claudications, no lower extremity edema orthopnea or proximal nocturnal dyspnea.   06/28/2017 - 1 month follow up, Ca score 1086. This was 25 percentile for age and sex matched control. Coronary CTA showed two-vessel obstructive coronary artery disease. The patient underwent cardiac with Dr. Katrinka Blazing on 05/23/2017 that showed   75% first diagonal obstruction.  40% proximal LAD and 25% mid LAD obstruction.  25% first obtuse marginal  Tandem 40 and 20% RCA stenoses. Normal left ventricular function with EF 55%. Normal hemodynamics.  The patient was started on rosuvastatin did cause significant muscle pain and was eventually transitioned to Repatha.  He also experienced significant hypotension with lisinopril was discontinued.   His most recent labs from September 2019 showed normal LFTs, triglycerides 157, LDL 53, and HDL 47.  10/13/2018 -the patient is coming for follow-up, he has been feeling well and denies any chest pain or shortness of breath, he has been compliant with his  medications, and has no side effects.  He previously developed significant orthostatic hypotension with 5 mg of lisinopril but it was eventually discontinued.  He broke his ankle 2 months ago and has been unable to exercise since then.   Past Medical History:  Diagnosis Date  . Asthma   . Hyperlipidemia    Past Surgical History:  Procedure Laterality Date  . LEFT HEART CATH AND CORONARY ANGIOGRAPHY N/A 05/23/2017   Procedure: Left Heart Cath and Coronary Angiography;  Surgeon: Lyn Records, MD;  Location: [facility] INVASIVE CV LAB;  Service: Cardiovascular;  Laterality: N/A;    Current Medications: Current Meds  Medication Sig  . albuterol (PROVENTIL HFA;VENTOLIN HFA) 108 (90 Base) MCG/ACT inhaler Inhale 2 puffs into the lungs as needed.  Marland Kitchen esomeprazole (NEXIUM) 40 MG capsule Take 40 mg by mouth daily as needed (acid reflux).   . Evolocumab (REPATHA SURECLICK) 140 MG/ML SOAJ Inject 1 pen into the skin every 14 (fourteen) days.  . Omega-3 Fatty Acids ([employer]S FISH OIL) 1000 MG CAPS TAKE 1 CAPSULE (1,000 MG TOTAL) BY MOUTH DAILY.  Bertram Gala Glycol-Propyl Glycol (SYSTANE OP) Apply 1 drop to eye daily as needed (dry eyes).  Marland Kitchen testosterone cypionate (DEPOTESTOTERONE CYPIONATE) 100 MG/ML injection Inject into the muscle.  . zolpidem (AMBIEN) 5 MG tablet Take 1 tablet by mouth as needed.     Allergies:   Cashew nut oil; Polyethylene glycol; and Nsaids   Social History   Socioeconomic History  . Marital status: Married    Spouse name: Not on file  . Number of children: Not on file  .  Years of education: Not on file  . Highest education level: Not on file  Occupational History  . Not on file  Social Needs  . Financial resource strain: Not on file  . Food insecurity:    Worry: Not on file    Inability: Not on file  . Transportation needs:    Medical: Not on file    Non-medical: Not on file  Tobacco Use  . Smoking status: Never Smoker  . Smokeless tobacco: Former Engineer, water and  Sexual Activity  . Alcohol use: Yes    Comment: social drinker  . Drug use: No  . Sexual activity: Yes    Birth control/protection: None  Lifestyle  . Physical activity:    Days per week: Not on file    Minutes per session: Not on file  . Stress: Not on file  Relationships  . Social connections:    Talks on phone: Not on file    Gets together: Not on file    Attends religious service: Not on file    Active member of club or organization: Not on file    Attends meetings of clubs or organizations: Not on file    Relationship status: Not on file  Other Topics Concern  . Not on file  Social History Narrative  . Not on file    Family History: The patient's family history includes AAA (abdominal aortic aneurysm) in his sister; Heart attack in his father; Heart disease in his father; Ovarian cancer in his mother.   ROS:   Please see the history of present illness.     All other systems reviewed and are negative.  EKGs/Labs/Other Studies Reviewed:    The following studies were reviewed today:  EKG:  EKG is  ordered today.  The ekg ordered today demonstrates Normal sinus rhythm minimal voltage criteria for LVH otherwise normal EKG no prior EKG available for comparison.  Recent Labs: 05/15/2018: BUN 15; Creatinine, Ser 1.10; Potassium 4.8; Sodium 137 07/22/2018: ALT 29   Recent Lipid Panel    Component Value Date/Time   CHOL 131 07/22/2018 0928   TRIG 157 (H) 07/22/2018 0928   HDL 47 07/22/2018 0928   CHOLHDL 2.8 07/22/2018 0928   LDLCALC 53 07/22/2018 0928    Physical Exam:    VS:  BP (!) 132/100   Pulse 60   Ht 5\' 11"  (1.803 m)   Wt 231 lb 3.2 oz (104.9 kg)   SpO2 99%   BMI 32.25 kg/m     Wt Readings from Last 3 Encounters:  10/13/18 231 lb 3.2 oz (104.9 kg)  04/22/18 237 lb (107.5 kg)  06/28/17 228 lb (103.4 kg)     GEN:  Well nourished, well developed in no acute distress HEENT: Normal NECK: No JVD; No carotid bruits LYMPHATICS: No  lymphadenopathy CARDIAC: RRR, no murmurs, rubs, gallops RESPIRATORY:  Clear to auscultation without rales, wheezing or rhonchi  ABDOMEN: Soft, non-tender, non-distended MUSCULOSKELETAL:  No edema; No deformity  SKIN: Warm and dry NEUROLOGIC:  Alert and oriented x 3 PSYCHIATRIC:  Normal affect   ASSESSMENT:    1. Mixed hyperlipidemia   2. Essential hypertension   3. Ascending aortic aneurysm (HCC)   4. Coronary artery disease involving native coronary artery of native heart without angina pectoris      PLAN:    In order of problems listed above:  1. CAD - with diffuse coronary artery disease on cardiac July 2018 with 75% stenosis in the first diagonal vessel  that is small. LVEF 55-60%.  He is asymptomatic, will continue an aggressive medical management with aspirin 81 mg daily and Repatha, fish oil.  His EKG is completely normal and unchanged from prior.  2. Hyperlipidemia - evidence of diffuse coronary artery disease, now on Repatha as he was intolerant to statins.  He also takes over-the-counter fish oil 1000 mg daily.  LDL is at goal, triglycerides are borderline.  3. Ascending aortic aneurysm -stable 44 mm on repeat chest CT in August 2019.  Commendation is to repeat annually.  4.  Hypertension with ascending aortic aneurysm, we will restart losartan 25 mg to be taken at night, we will obtain an echocardiogram to evaluate for degree of LVH and possible AI even though he has no murmur.  Follow up in 3 months with labs.   Medication Adjustments/Labs and Tests Ordered: Current medicines are reviewed at length with the patient today.  Concerns regarding medicines are outlined above. Labs and tests ordered and medication changes are outlined in the patient instructions below:  There are no Patient Instructions on file for this visit.   Signed, Tobias AlexanderKatarina Lexander Tremblay, MD  10/13/2018 9:50 AM    [facility]

## 2018-10-13 NOTE — Patient Instructions (Signed)
Medication Instructions:   START TAKING LOSARTAN 25 MG ONCE DAILY    Labwork:  IN ONE MONTH TO CHECK--CMET, TSH, AND LIPIDS--PLEASE COME FASTING TO THIS LAB APPOINTMENT    Testing/Procedures:  Your physician has requested that you have an echocardiogram. Echocardiography is a painless test that uses sound waves to create images of your heart. It provides your doctor with information about the size and shape of your heart and how well your heart's chambers and valves are working. This procedure takes approximately one hour. There are no restrictions for this procedure.    Follow-Up:  3 MONTHS WITH DR Delton SeeNELSON  Any Other Special Instructions Will Be Listed Below (If Applicable).     If you need a refill on your cardiac medications before your next appointment, please call your pharmacy.

## 2018-11-18 ENCOUNTER — Ambulatory Visit (HOSPITAL_COMMUNITY): Payer: Commercial Managed Care - PPO | Attending: Cardiovascular Disease

## 2018-11-18 ENCOUNTER — Other Ambulatory Visit: Payer: Self-pay

## 2018-11-18 ENCOUNTER — Other Ambulatory Visit: Payer: Commercial Managed Care - PPO

## 2018-11-18 DIAGNOSIS — E782 Mixed hyperlipidemia: Secondary | ICD-10-CM

## 2018-11-18 DIAGNOSIS — I10 Essential (primary) hypertension: Secondary | ICD-10-CM | POA: Insufficient documentation

## 2018-11-18 DIAGNOSIS — I2510 Atherosclerotic heart disease of native coronary artery without angina pectoris: Secondary | ICD-10-CM

## 2018-11-18 DIAGNOSIS — I7121 Aneurysm of the ascending aorta, without rupture: Secondary | ICD-10-CM

## 2018-11-18 DIAGNOSIS — I712 Thoracic aortic aneurysm, without rupture: Secondary | ICD-10-CM | POA: Diagnosis not present

## 2018-11-18 LAB — COMPREHENSIVE METABOLIC PANEL
ALT: 29 IU/L (ref 0–44)
AST: 24 IU/L (ref 0–40)
Albumin/Globulin Ratio: 2.1 (ref 1.2–2.2)
Albumin: 4.5 g/dL (ref 3.8–4.8)
Alkaline Phosphatase: 47 IU/L (ref 39–117)
BUN/Creatinine Ratio: 15 (ref 10–24)
BUN: 16 mg/dL (ref 8–27)
Bilirubin Total: 0.6 mg/dL (ref 0.0–1.2)
CO2: 24 mmol/L (ref 20–29)
Calcium: 9.3 mg/dL (ref 8.6–10.2)
Chloride: 98 mmol/L (ref 96–106)
Creatinine, Ser: 1.1 mg/dL (ref 0.76–1.27)
GFR calc Af Amer: 83 mL/min/{1.73_m2} (ref >59)
GFR calc non Af Amer: 72 mL/min/{1.73_m2} (ref >59)
Globulin, Total: 2.1 g/dL (ref 1.5–4.5)
Glucose: 115 mg/dL — ABNORMAL HIGH (ref 65–99)
Potassium: 4.5 mmol/L (ref 3.5–5.2)
Sodium: 138 mmol/L (ref 134–144)
Total Protein: 6.6 g/dL (ref 6.0–8.5)

## 2018-11-18 LAB — LIPID PANEL
Chol/HDL Ratio: 2.6 ratio (ref 0.0–5.0)
Cholesterol, Total: 128 mg/dL (ref 100–199)
HDL: 50 mg/dL (ref >39)
LDL Calculated: 49 mg/dL (ref 0–99)
Triglycerides: 146 mg/dL (ref 0–149)
VLDL Cholesterol Cal: 29 mg/dL (ref 5–40)

## 2018-11-18 LAB — TSH: TSH: 1.18 u[IU]/mL (ref 0.450–4.500)

## 2018-11-19 ENCOUNTER — Telehealth: Payer: Self-pay | Admitting: *Deleted

## 2018-11-19 DIAGNOSIS — I712 Thoracic aortic aneurysm, without rupture: Secondary | ICD-10-CM

## 2018-11-19 DIAGNOSIS — I7121 Aneurysm of the ascending aorta, without rupture: Secondary | ICD-10-CM

## 2018-11-19 NOTE — Telephone Encounter (Signed)
Notified the pt that per [name], his echo showed normal LVEF, grade 1 diastolic dysfunction (pt education provided), and he will be due for a repeat Chest CTA in August 2020, for known ascending aortic aneurysm.  Endorsed to the pt that I will place the order for the Chest CTA in the system, and send a message to our [facility] scheduler/CT scheduler, to call him back and arrange this test for August 2020.  Pt verbalized understanding and agrees with this plan.

## 2018-11-19 NOTE — Telephone Encounter (Signed)
-----   Message from Lars Masson, MD sent at 11/19/2018 11:13 AM EST ----- His echocardiogram shows normal LVEF grade 1 diastolic dysfunction that is age-appropriate, ascending aortic aneurysm that we know about, we will repeat chest CTA in August 2020 again.  Kathyrn Warmuth please order.

## 2018-11-26 ENCOUNTER — Encounter: Payer: Self-pay | Admitting: Cardiology

## 2019-01-12 ENCOUNTER — Ambulatory Visit: Payer: Commercial Managed Care - PPO | Admitting: Cardiology

## 2019-01-12 NOTE — Telephone Encounter (Signed)
Thank you for letting us know, please discontinue losartan and if your cough improves without losartan will probably not want a restarted again but start alternative blood pressure medication.  Please keep [place] posted about your symptoms.

## 2019-01-18 IMAGING — CT CT ANGIO CHEST
2 of 7 series · 18 of 36 positions shown · IV contrast (iopamidol)
Comparison: 05/14/2017

CLINICAL DATA: Thoracic aortic aneurysm, follow-up

EXAM:
CT ANGIOGRAPHY CHEST WITH CONTRAST
TECHNIQUE: Multidetector CT imaging of the chest was performed using the
standard protocol during bolus administration of intravenous
contrast. Multiplanar CT image reconstructions and MIPs were
obtained to evaluate the vascular anatomy.
CONTRAST:  100mL [id number] IOPAMIDOL ([id number]) INJECTION 76%

[Series 4: aorta 3.0 i31f 2 · axial · 0.82mm/px · z∈[+1078,+1378]mm · 17 of 110 slices shown]
[im 5/110  lung]
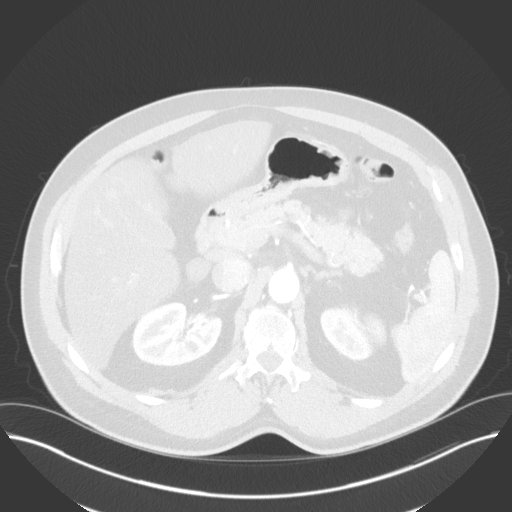
[im 10/110  mediastinal]
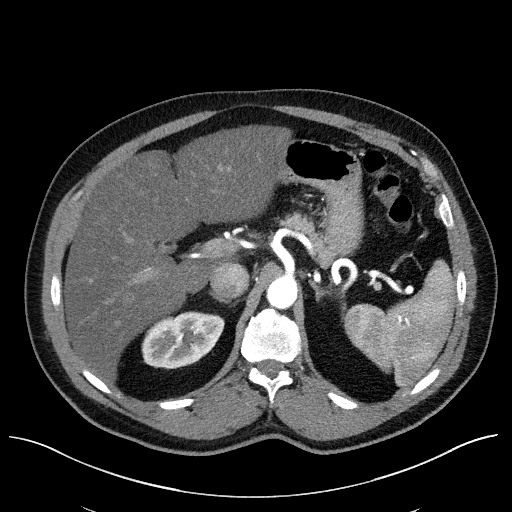
[im 19/110  lung]
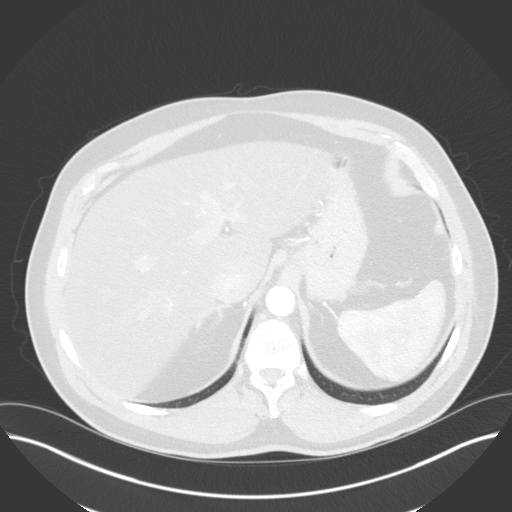
[im 24/110  mediastinal]
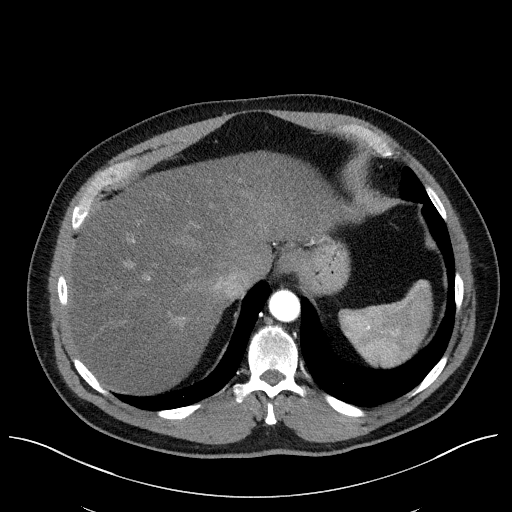
[im 29/110  lung]
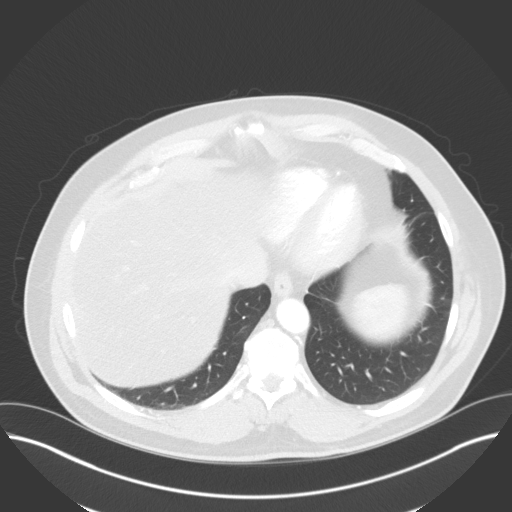
[im 38/110  mediastinal]
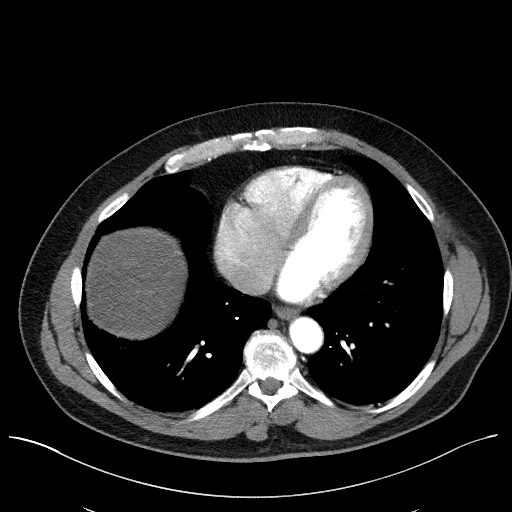
[im 43/110  lung]
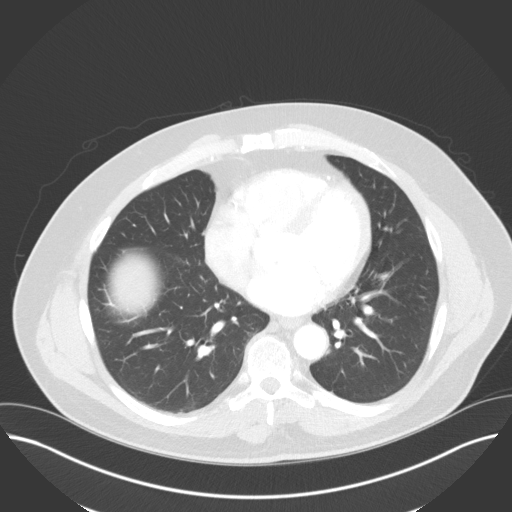
[im 48/110  mediastinal]
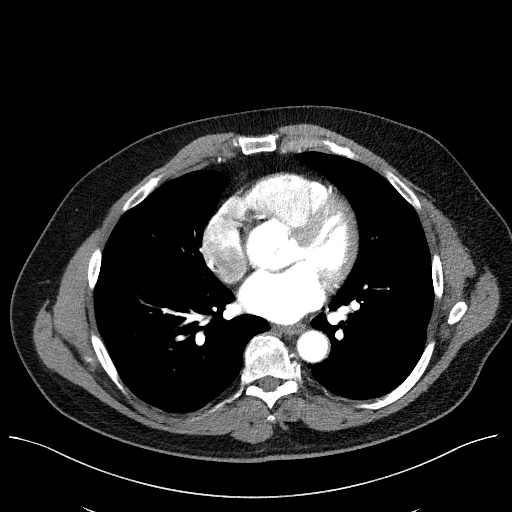
[im 57/110  lung]
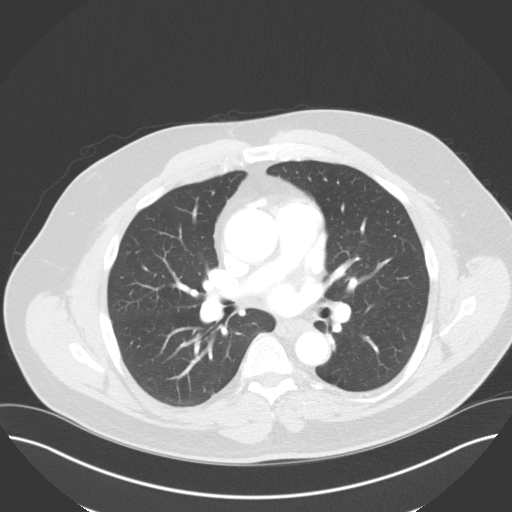
[im 62/110  mediastinal]
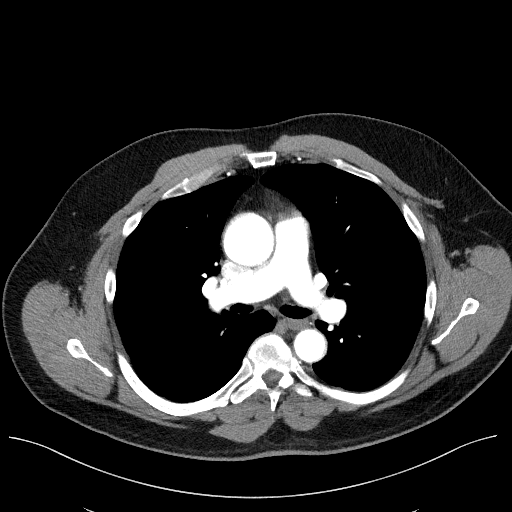
[im 67/110  lung]
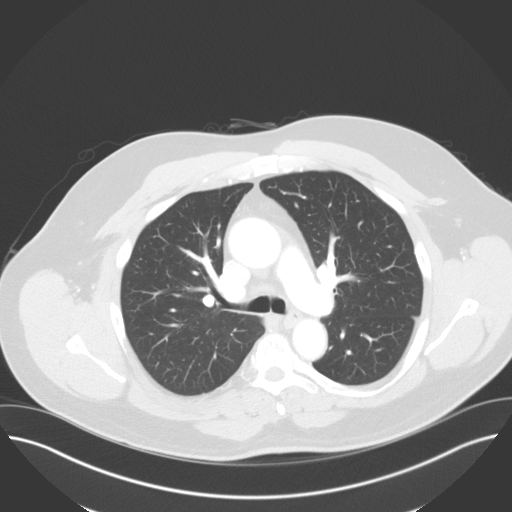
[im 72/110  mediastinal]
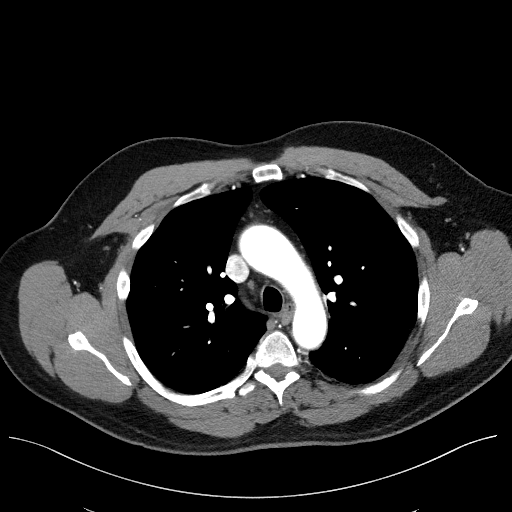
[im 81/110  lung]
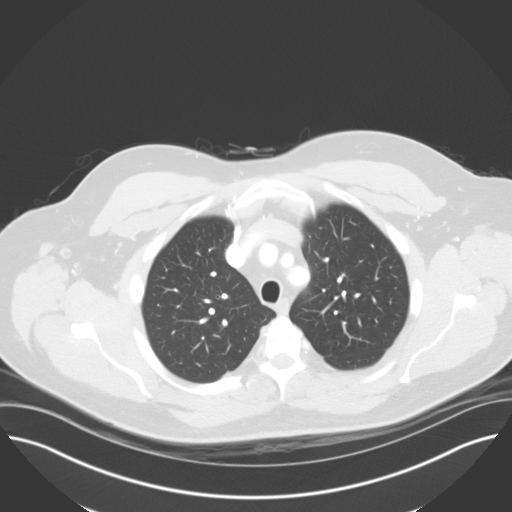
[im 86/110  mediastinal]
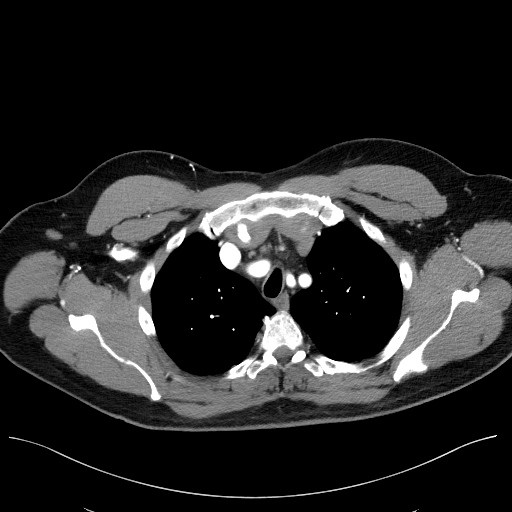
[im 91/110  lung]
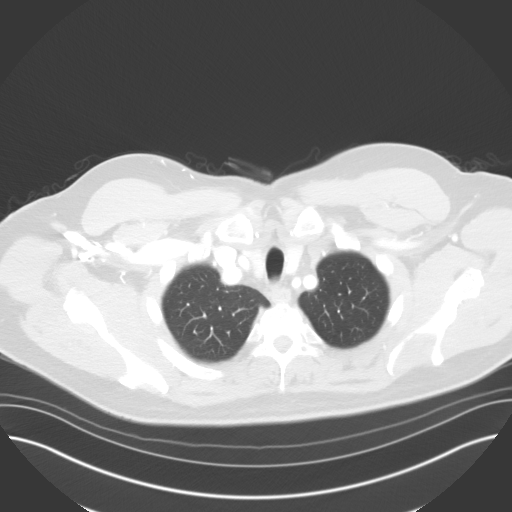
[im 100/110  mediastinal]
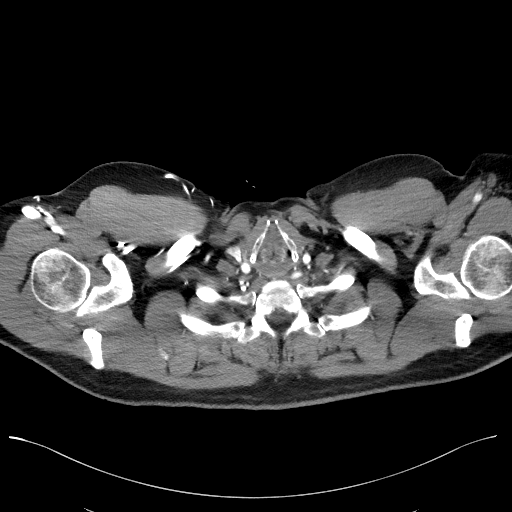
[im 105/110  lung]
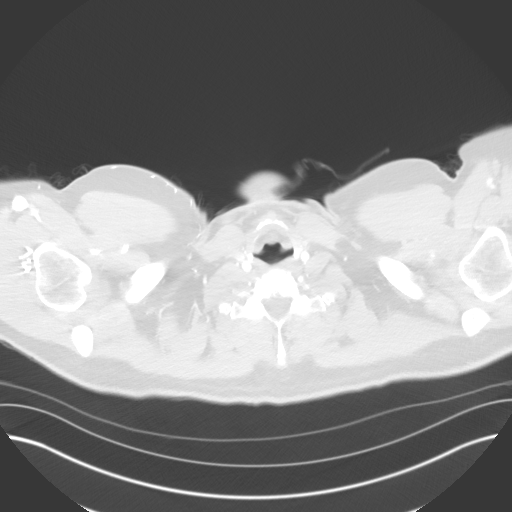

[Series 7: coronals · coronal · 0.67mm/px · 1 of 138 slices shown]
[im 69/138  mediastinal]
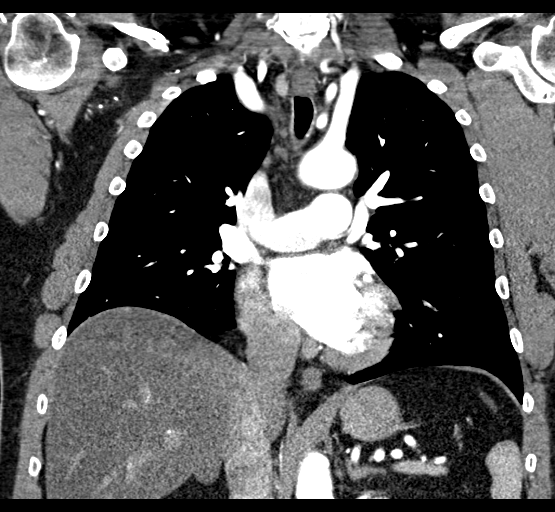

[18 of 36 positions shown; findings below may reference images not displayed]

FINDINGS: Cardiovascular: Heart size upper limits normal. No pericardial
effusion. Satisfactory opacification of pulmonary arteries noted,
and there is no evidence of pulmonary emboli. Moderate coronary
calcifications. Thoracic aorta at transverse diameters as follows:

4.6 cm sinuses of Valsalva

3.4 cm Moa junction

4.4 cm proximal ascending (previously 4.5)

3.5 cm distal ascending/proximal arch

2.8 cm distal arch

2.8 cm proximal descending

2.4 cm distal descending

There is good contrast opacification of the thoracic aorta. No
dissection or stenosis. Classic 3 vessel brachiocephalic arterial
origin anatomy without proximal stenosis. Visualized portions of
suprarenal abdominal aorta unremarkable. Replaced right hepatic
arterial supply from the SMA, an anatomic variant.

Mediastinum/Nodes:   No hilar or mediastinal adenopathy.

Lungs/Pleura: No pleural effusion. No pneumothorax. Lungs are clear.

Upper Abdomen: Fatty liver.  No acute findings.

Musculoskeletal: Mild spondylitic change in the visualized lower
cervical and lower thoracic spine. No fracture or worrisome bone
lesion.

Review of the MIP images confirms the above findings.
IMPRESSION: 1. 4.4 cm ascending thoracic aortic aneurysm without complicating
features. Recommend annual imaging followup by CTA or MRA. This
recommendation follows 4161
ACCF/AHA/AATS/ACR/ASA/SCA/BASERA/TJIKUME/SALTENIS/CASEEDOSS Guidelines for the
Diagnosis and Management of Patients with Thoracic Aortic Disease.
Circulation. 4161; 121: [id number]
2. Fatty liver.

## 2019-01-29 ENCOUNTER — Other Ambulatory Visit: Payer: Self-pay | Admitting: Cardiology

## 2019-01-29 DIAGNOSIS — I7121 Aneurysm of the ascending aorta, without rupture: Secondary | ICD-10-CM

## 2019-01-29 DIAGNOSIS — I2510 Atherosclerotic heart disease of native coronary artery without angina pectoris: Secondary | ICD-10-CM

## 2019-01-29 DIAGNOSIS — I10 Essential (primary) hypertension: Secondary | ICD-10-CM

## 2019-01-29 DIAGNOSIS — I712 Thoracic aortic aneurysm, without rupture: Secondary | ICD-10-CM

## 2019-01-29 DIAGNOSIS — E782 Mixed hyperlipidemia: Secondary | ICD-10-CM

## 2019-02-18 ENCOUNTER — Encounter: Payer: Self-pay | Admitting: *Deleted

## 2019-02-18 ENCOUNTER — Telehealth: Payer: Self-pay | Admitting: *Deleted

## 2019-02-18 NOTE — Telephone Encounter (Signed)
I responded to patients MyChart message earlier today with similar recommendations to Dr. Delton See. I will put patient on our list to call in a few weeks to follow up

## 2019-02-18 NOTE — Telephone Encounter (Signed)
Virtual Visit Pre-Appointment Phone Call  "(Name), I am calling you today to discuss your upcoming appointment. We are currently trying to limit exposure to the virus that causes COVID-19 by seeing patients at home rather than in the office."  1. "What is the BEST phone number to call the day of the visit?" - include this in appointment notes YES CONFIRMED  2. Do you have or have access to (through a family member/friend) a smartphone with video capability that we can use for your visit?" a. If yes - list this number in appt notes as cell (if different from BEST phone #) and list the appointment type as a VIDEO visit in appointment notes PT HAS A SMART PHONE AND WILL DO VIDEO VISIT WITH DR NELSON ON 5/13 AT 1040, VIA DOXYME USE.   Confirm consent - "In the setting of the current Covid19 crisis, you are scheduled for a (phone or video) visit with your provider on (date) at (time).  Just as we do with many in-office visits, in order for you to participate in this visit, we must obtain consent.  If you'd like, I can send this to your mychart (if signed up) or email for you to review.  Otherwise, I can obtain your verbal consent now.  All virtual visits are billed to your insurance company just like a normal visit would be.  By agreeing to a virtual visit, we'd like you to understand that the technology does not allow for your provider to perform an examination, and thus may limit your provider's ability to fully assess your condition. If your provider identifies any concerns that need to be evaluated in person, we will make arrangements to do so.  Finally, though the technology is pretty good, we cannot assure that it will always work on either your or our end, and in the setting of a video visit, we may have to convert it to a phone-only visit.  In either situation, we cannot ensure that we have a secure connection.  Are you willing to proceed?" STAFF: Did the patient verbally acknowledge consent to  telehealth visit? Document YES/NO here:HE GAVE VERBAL CONSENT TO TREAT AS WELL AS CONSENT TO TREAT WILL BE SENT TO HIS ACTIVE MYCHART ACCOUNT FOR [name] TO DO A VIDEO VISIT ON HIM ON 03/11/19.   3. Advise patient to be prepared - "Two hours prior to your appointment, go ahead and check your blood pressure, pulse, oxygen saturation, and your weight (if you have the equipment to check those) and write them all down. When your visit starts, your provider will ask you for this information. If you have an Apple Watch or Kardia device, please plan to have heart rate information ready on the day of your appointment. Please have a pen and paper handy nearby the day of the visit as well."  4. Give patient instructions for MyChart download to smartphone OR Doximity/Doxy.me as below if video visit (depending on what platform provider is using)-YES PT AWARE  5. Inform patient they will receive a phone call 15 minutes prior to their appointment time (may be from unknown caller ID) so they should be prepared to answer    TELEPHONE CALL NOTE  Leonard Hawkins has been deemed a candidate for a follow-up tele-health visit to limit community exposure during the Covid-19 pandemic. I spoke with the patient via phone to ensure availability of phone/video source, confirm preferred email & phone number, and discuss instructions and expectations.  I reminded Leonard Fearing  Hawkins to be prepared with any vital sign and/or heart rhythm information that could potentially be obtained via home monitoring, at the time of his visit. I reminded Leonard Hawkins to expect a phone call prior to his visit.  Loa SocksMartin,Genesia Caslin M, LPN 1/61/09604/22/2020 45:4011:36 AM   IF USING DOXIMITY or DOXY.ME - The patient will receive a link just prior to their visit by text.  YES HE'S AWARE OF THIS     FULL LENGTH CONSENT FOR TELE-HEALTH VISIT   I hereby voluntarily request, consent and authorize [facility] and its employed or contracted physicians, physician  assistants, nurse practitioners or other licensed health care professionals (the Practitioner), to provide me with telemedicine health care services (the Services") as deemed necessary by the treating Practitioner. I acknowledge and consent to receive the Services by the Practitioner via telemedicine. I understand that the telemedicine visit will involve communicating with the Practitioner through live audiovisual communication technology and the disclosure of certain medical information by electronic transmission. I acknowledge that I have been given the opportunity to request an in-person assessment or other available alternative prior to the telemedicine visit and am voluntarily participating in the telemedicine visit.  I understand that I have the right to withhold or withdraw my consent to the use of telemedicine in the course of my care at any time, without affecting my right to future care or treatment, and that the Practitioner or I may terminate the telemedicine visit at any time. I understand that I have the right to inspect all information obtained and/or recorded in the course of the telemedicine visit and may receive copies of available information for a reasonable fee.  I understand that some of the potential risks of receiving the Services via telemedicine include:   Delay or interruption in medical evaluation due to technological equipment failure or disruption;  Information transmitted may not be sufficient (e.g. poor resolution of images) to allow for appropriate medical decision making by the Practitioner; and/or   In rare instances, security protocols could fail, causing a breach of personal health information.  Furthermore, I acknowledge that it is my responsibility to provide information about my medical history, conditions and care that is complete and accurate to the best of my ability. I acknowledge that Practitioner's advice, recommendations, and/or decision may be based on  factors not within their control, such as incomplete or inaccurate data provided by me or distortions of diagnostic images or specimens that may result from electronic transmissions. I understand that the practice of medicine is not an exact science and that Practitioner makes no warranties or guarantees regarding treatment outcomes. I acknowledge that I will receive a copy of this consent concurrently upon execution via email to the email address I last provided but may also request a printed copy by calling the office of [facility].    I understand that my insurance will be billed for this visit.   I have read or had this consent read to me.  I understand the contents of this consent, which adequately explains the benefits and risks of the Services being provided via telemedicine.   I have been provided ample opportunity to ask questions regarding this consent and the Services and have had my questions answered to my satisfaction.  I give my informed consent for the services to be provided through the use of telemedicine in my medical care  By participating in this telemedicine visit I agree to the above.  YES PT GAVE VERBAL CONSENT AS WELL AS  HE IS AWARE THAT CONSENT TO TREAT WILL BE SENT TO HIS [facility] FOR HIM TO REVIEW PRIOR TO HIS VIDEO VISIT WITH [name] ON 5/13.

## 2019-02-18 NOTE — Telephone Encounter (Signed)
Leonard Hawkins,  Please advised him to hold Repatha for the next two weeks (hold 1 dose) and see if that provides any relief. Please schedule him for a videovisit in 2-3 weeks with me.  Thank you,  KN    Spoke with the pt and endorsed to him recommendations per [name].  Scheduled the pt a virtual video visit with [name] on 5/13 at 1040.  Pt is aware of how the video virtual visit will work.  Also informed the pt that I will inform our Lipid clinic pharmacist about his repatha issues, for [name] would like for him to have a visit with them as well.  Informed the pt that they will soon be in contact with him as well. Pt verbalized understanding and agrees with this plan.

## 2019-03-06 ENCOUNTER — Telehealth: Payer: Self-pay | Admitting: Pharmacist

## 2019-03-06 NOTE — Telephone Encounter (Signed)
Spoke with patient. He states that his pain has not improved. He is seeing a [facility] and he has an appointment with the orthopedic doctor. State from the Xrays they did it looks like he has some arthritis and curvature. It has been about 3 weeks since his last injection. Discussed how it appears as though his pain is from structure issues and wear and tear and not medication related. Patient is hesitant to resume Repatha. I suggested that it is reasonable to skip one more dose. However if he is not feeling better after that (or if he gets relief only from something the orthopedic dr does), then we can say with pretty good confidence that this is not a medication reaction and he should resume Repatha. Advised for him to call us and let us know how he is doing. We can talk about alternative treatment options for his cholesterol/lowering cardiovascular risk, however they all come with a low chance of muscle aches/pains. His LDL has responded very well to Repatha. Patient agreeable to plan.

## 2019-03-06 NOTE — Telephone Encounter (Signed)
Called to follow up with patient on MyChart message sent about 2 weeks ago. Patient was experiencing back and neck pains, thought to be from Repatha. Patient was instructed to hold a dose. Following up to see how patient is feeling and discuss alternative treatment options if needed. Left voicemail for patient to return call

## 2019-03-11 ENCOUNTER — Telehealth (INDEPENDENT_AMBULATORY_CARE_PROVIDER_SITE_OTHER): Payer: Commercial Managed Care - PPO | Admitting: Cardiology

## 2019-03-11 ENCOUNTER — Telehealth: Payer: Self-pay | Admitting: Cardiology

## 2019-03-11 ENCOUNTER — Encounter: Payer: Self-pay | Admitting: Cardiology

## 2019-03-11 ENCOUNTER — Encounter: Payer: Self-pay | Admitting: *Deleted

## 2019-03-11 ENCOUNTER — Other Ambulatory Visit: Payer: Self-pay

## 2019-03-11 DIAGNOSIS — I2510 Atherosclerotic heart disease of native coronary artery without angina pectoris: Secondary | ICD-10-CM | POA: Diagnosis not present

## 2019-03-11 DIAGNOSIS — I712 Thoracic aortic aneurysm, without rupture: Secondary | ICD-10-CM

## 2019-03-11 DIAGNOSIS — I10 Essential (primary) hypertension: Secondary | ICD-10-CM | POA: Diagnosis not present

## 2019-03-11 DIAGNOSIS — E782 Mixed hyperlipidemia: Secondary | ICD-10-CM

## 2019-03-11 DIAGNOSIS — I7121 Aneurysm of the ascending aorta, without rupture: Secondary | ICD-10-CM

## 2019-03-11 DIAGNOSIS — R931 Abnormal findings on diagnostic imaging of heart and coronary circulation: Secondary | ICD-10-CM

## 2019-03-11 MED ORDER — LOSARTAN POTASSIUM 25 MG PO TABS: 25.0000 mg | ORAL_TABLET | Freq: Every day | ORAL | 3 refills | Status: DC

## 2019-03-11 MED ORDER — CLOPIDOGREL BISULFATE 75 MG PO TABS: 75.0000 mg | ORAL_TABLET | Freq: Every day | ORAL | 3 refills | Status: DC

## 2019-03-11 NOTE — Progress Notes (Signed)
Virtual Visit via Video Note   This visit type was conducted due to national recommendations for restrictions regarding the COVID-19 Pandemic (e.g. social distancing) in an effort to limit this patient's exposure and mitigate transmission in our community.  Due to his co-morbid illnesses, this patient is at least at moderate risk for complications without adequate follow up.  This format is felt to be most appropriate for this patient at this time.  All issues noted in this document were discussed and addressed.  A limited physical exam was performed with this format.  Please refer to the patient's chart for his consent to telehealth for [facility].   Date:  03/11/2019   ID:  Leonard Hawkins, DOB 05-27-1956, MRN [id number]  Patient Location: Home Provider Location: Home  PCP:  Leonard Buddy, MD  Cardiologist:  Leonard Alexander, MD  Electrophysiologist:  None   Evaluation Performed:  Follow-Up Visit  Chief Complaint:  Back pain  History of Present Illness:    Leonard Hawkins is a 63 y.o. male with a hx of Premature coronary artery disease in his father, cardiomegaly in his brother, hyperlipidemia who has been experiencing exertional dyspnea on exertion associated with dizziness and diaphoresis and retrosternal chest pressure.   In 2018 coronary CTA showed Ca score 1086. This was 5 percentile for age and sex matched control. Coronary CTA showed two-vessel obstructive coronary artery disease.  Cardiac cath in 04/2017 showed   75% first diagonal obstruction.  40% proximal LAD and 25% mid LAD obstruction.  25% first obtuse marginal  Tandem 40 and 20% RCA stenoses. Normal left ventricular function with EF 55%. Normal hemodynamics.  The patient was started on rosuvastatin did cause significant muscle pain and was eventually transitioned to Repatha.  He also experienced significant hypotension with lisinopril was discontinued.   His most recent labs from September 2019 showed  normal LFTs, triglycerides 157, LDL 53, and HDL 47.  03/11/2019 - the patient has been walking 1-2 miles/day with no chest pain or SOB. He has been having significant back pains and he tried to d/c losartan and repatha for a month with no improvement. His orthopedic surgeon started him on physical therapy. His BP has been elevated. Denies any LE edema.  The patient does not have symptoms concerning for COVID-19 infection (fever, chills, cough, or new shortness of breath).   Past Medical History:  Diagnosis Date  . Asthma   . Hyperlipidemia    Past Surgical History:  Procedure Laterality Date  . LEFT HEART CATH AND CORONARY ANGIOGRAPHY N/A 05/23/2017   Procedure: Left Heart Cath and Coronary Angiography;  Surgeon: Lyn Records, MD;  [facility];  Service: Cardiovascular;  Laterality: N/A;     Current Meds  Medication Sig  . albuterol (PROVENTIL HFA;VENTOLIN HFA) 108 (90 Base) MCG/ACT inhaler Inhale 2 puffs into the lungs as needed.  Marland Kitchen albuterol (PROVENTIL) (2.5 MG/3ML) 0.083% nebulizer solution TAKE 3 MLS (2.5 MG DOSE) BY NEBULIZATION EVERY 6 (SIX) HOURS AS NEEDED FOR WHEEZING.  . B-D INSULIN SYRINGE 1CC/25GX1" 25G X 1" 1 ML MISC   . BD DISP NEEDLES 27G X 1/2" MISC USE WITH TESTOSTERONE INJECTION ONCE EVERY 14 DAYS  . celecoxib (CELEBREX) 200 MG capsule Take 200 mg by mouth as needed.   Marland Kitchen esomeprazole (NEXIUM) 40 MG capsule Take 40 mg by mouth daily.   . famotidine (PEPCID) 20 MG tablet Take 20 mg by mouth as needed.   . fluticasone (FLONASE) 50 MCG/ACT nasal spray ONE  SPRAY BY NASAL ROUTE DAILY AS NEEDED FOR RHINITIS.  Marland Kitchen guaiFENesin (MUCINEX) 600 MG 12 hr tablet Take 600 mg by mouth 2 (two) times daily as needed.   . metaxalone (SKELAXIN) 800 MG tablet TAKE 1 TABLET BY MOUTH THREE TIMES A DAY AS NEEDED FOR PAIN  . Omega-3 Fatty Acids ([facility] FISH OIL) 1000 MG CAPS TAKE 1 CAPSULE (1,000 MG TOTAL) BY MOUTH DAILY.  Bertram Gala Glycol-Propyl Glycol (SYSTANE OP) Apply 1 drop  to eye daily as needed (dry eyes).  Marland Kitchen Spacer/Aero-Holding Chambers (OPTICHAMBER DIAMOND) MISC USE WITH HFA INHALER  . Syringe, Disposable, 1 ML MISC Use with testosterone injection once every 14 days  . zolpidem (AMBIEN) 5 MG tablet Take 1 tablet by mouth as needed.     Allergies:   Cashew nut oil; Polyethylene glycol; and Nsaids   Social History   Tobacco Use  . Smoking status: Never Smoker  . Smokeless tobacco: Former Engineer, water Use Topics  . Alcohol use: Yes    Comment: social drinker  . Drug use: No     Family Hx: The patient's family history includes AAA (abdominal aortic aneurysm) in his sister; Heart attack in his father; Heart disease in his father; Ovarian cancer in his mother.  ROS:   Please see the history of present illness.    All other systems reviewed and are negative.   Prior CV studies:   The following studies were reviewed today:  TTE:11/18/2018 Left ventricle: The cavity size was normal. Systolic function was   normal. The estimated ejection fraction was in the range of 60%   to 65%. Wall motion was normal; there were no regional wall   motion abnormalities. Doppler parameters are consistent with   abnormal left ventricular relaxation (grade 1 diastolic   dysfunction). - Aortic valve: Transvalvular velocity was within the normal range.   There was no stenosis. There was no regurgitation. - Aorta: Ascending aortic diameter: 46 mm (S). - Ascending aorta: The ascending aorta was moderately dilated. - Mitral valve: Transvalvular velocity was within the normal range.   There was no evidence for stenosis. There was trivial   regurgitation. - Right ventricle: The cavity size was normal. Wall thickness was   normal. Systolic function was normal. - Atrial septum: No defect or patent foramen ovale was identified   by color flow Doppler. - Tricuspid valve: There was mild regurgitation. - Pulmonary arteries: Systolic pressure was within the normal   range.  PA peak pressure: 23 mm Hg (S). - Global longitudinal strain -15.0% (abnormal).  Labs/Other Tests and Data Reviewed:    EKG:  No ECG reviewed.  Recent Labs: 11/18/2018: ALT 29; BUN 16; Creatinine, Ser 1.10; Potassium 4.5; Sodium 138; TSH 1.180   Recent Lipid Panel Lab Results  Component Value Date/Time   CHOL 128 11/18/2018 09:04 AM   TRIG 146 11/18/2018 09:04 AM   HDL 50 11/18/2018 09:04 AM   CHOLHDL 2.6 11/18/2018 09:04 AM   LDLCALC 49 11/18/2018 09:04 AM   Wt Readings from Last 3 Encounters:  03/11/19 228 lb (103.4 kg)  10/13/18 231 lb 3.2 oz (104.9 kg)  04/22/18 237 lb (107.5 kg)     Objective:    Vital Signs:  BP (!) 141/96   Pulse 65   Ht 5' 11.5" (1.816 m)   Wt 228 lb (103.4 kg)   BMI 31.36 kg/m    VITAL SIGNS:  reviewed   ASSESSMENT & PLAN:    1. CAD - with diffuse coronary  artery disease on cardiac July 2018 with 75% stenosis in the first diagonal vessel that is small. LVEF 55-60%.  He is asymptomatic, will continue an aggressive medical management, however he couldn't tolerate ASA, we will start Plavix 75 mg po daily instead. He is advised to restart Repatha and fish oil.  .  2. Hyperlipidemia - restart Repatha, lipids at goal while taking it.  3. Ascending aortic aneurysm -stable 44 mm on repeat chest CT in August 2019.  Annual repeat scheduled for 06/02/2019.  4.  Hypertension with ascending aortic aneurysm, we will restart losartan 25 mg to be taken at night. He is advised to restart.  COVID-19 Education: The signs and symptoms of COVID-19 were discussed with the patient and how to seek care for testing (follow up with PCP or arrange E-visit).  The importance of social distancing was discussed today.  Time:   Today, I have spent 25 minutes with the patient with telehealth technology discussing the above problems.     Medication Adjustments/Labs and Tests Ordered: Current medicines are reviewed at length with the patient today.  Concerns regarding  medicines are outlined above.   Tests Ordered: No orders of the defined types were placed in this encounter.   Medication Changes: No orders of the defined types were placed in this encounter.   Disposition:  Follow up in 4 month(s)  Signed, Leonard AlexanderKatarina Westyn Driggers, MD  03/11/2019 10:49 AM    [facility]

## 2019-03-11 NOTE — Telephone Encounter (Signed)
Pt c/o medication issue:  1. Name of Medication: clopidogrel (PLAVIX) 75 MG tablet  2. How are you currently taking this medication (dosage and times per day)? As directed  3. Are you having a reaction (difficulty breathing--STAT)?  Na  4. What is your medication issue? [facility] in regards to a drug interaction between clopidogrel (PLAVIX) 75 MG tablet and Nexium that the patients takes prescribed by Mat Carne. Please advise.

## 2019-03-11 NOTE — Patient Instructions (Signed)
Medication Instructions:   START TAKING PLAVIX 75 MG BY MOUTH DAILY  If you need a refill on your cardiac medications before your next appointment, please call your pharmacy.     Testing/Procedures:  YOUR CHEST CT ANGIO OF AORTA IS ALREADY SCHEDULED AT OUR OFFICE FOR June 02, 2019 AT 3:30 PM   Follow-Up:  IN 4 MONTHS WITH [name] IN THE OFFICE ON Friday July 17, 2019 AT 8:00 AM

## 2019-03-11 NOTE — Telephone Encounter (Signed)
Its ok.  

## 2019-03-11 NOTE — Telephone Encounter (Signed)
Dr. Delton See and Pharmacy, this pt was prescribed Plavix today at his virtual with [place].  [facility] to inform that this interacts with his Nexium he was prescribed by another MD. Is this ok to continue this regimen? Please advise!

## 2019-03-11 NOTE — Telephone Encounter (Signed)
Called the pts pharmacy [facility] and spoke with Revonda Standard  and informed her that per [name], it is ok for the pt to start taking new script Plavix 75 mg po daily, with his Nexium regimen.  Revonda Standard verbalized understanding and agrees with this plan.

## 2019-05-13 ENCOUNTER — Telehealth: Payer: Self-pay

## 2019-05-13 NOTE — Telephone Encounter (Signed)
Called and spoke w/pt regarding working on an appeal for the repatha and instructed the pt to call us if they need any samples to get them through until the appeal is approved and the refill can be sent

## 2019-05-14 ENCOUNTER — Other Ambulatory Visit: Payer: Self-pay | Admitting: Pharmacist

## 2019-05-14 MED ORDER — REPATHA SURECLICK 140 MG/ML ~~LOC~~ SOAJ: 1.0000 "pen " | SUBCUTANEOUS | 11 refills | Status: DC

## 2019-05-15 ENCOUNTER — Other Ambulatory Visit: Payer: Self-pay | Admitting: Cardiology

## 2019-06-01 ENCOUNTER — Other Ambulatory Visit: Payer: Self-pay

## 2019-06-01 DIAGNOSIS — I712 Thoracic aortic aneurysm, without rupture: Secondary | ICD-10-CM

## 2019-06-01 DIAGNOSIS — I7121 Aneurysm of the ascending aorta, without rupture: Secondary | ICD-10-CM

## 2019-06-02 ENCOUNTER — Other Ambulatory Visit: Payer: Self-pay

## 2019-06-02 ENCOUNTER — Ambulatory Visit (INDEPENDENT_AMBULATORY_CARE_PROVIDER_SITE_OTHER)
Admission: RE | Admit: 2019-06-02 | Discharge: 2019-06-02 | Disposition: A | Discharge status: Home / Self Care | Payer: Commercial Managed Care - PPO | Source: Ambulatory Visit | Attending: Cardiology | Admitting: Cardiology

## 2019-06-02 DIAGNOSIS — I712 Thoracic aortic aneurysm, without rupture: Secondary | ICD-10-CM | POA: Diagnosis not present

## 2019-06-02 DIAGNOSIS — I7121 Aneurysm of the ascending aorta, without rupture: Secondary | ICD-10-CM

## 2019-06-02 MED ORDER — IOHEXOL 350 MG/ML SOLN
100.0000 mL | Freq: Once | INTRAVENOUS | Status: AC | PRN
  Administered 2019-06-02: 100 mL via INTRAVENOUS

## 2019-07-09 ENCOUNTER — Encounter: Payer: Self-pay | Admitting: *Deleted

## 2019-07-10 ENCOUNTER — Ambulatory Visit: Payer: Commercial Managed Care - PPO | Admitting: Cardiology

## 2019-07-16 ENCOUNTER — Encounter: Payer: Self-pay | Admitting: Cardiology

## 2019-07-16 ENCOUNTER — Other Ambulatory Visit: Payer: Self-pay

## 2019-07-16 ENCOUNTER — Ambulatory Visit (INDEPENDENT_AMBULATORY_CARE_PROVIDER_SITE_OTHER): Payer: Commercial Managed Care - PPO | Admitting: Cardiology

## 2019-07-16 ENCOUNTER — Ambulatory Visit
Admission: RE | Admit: 2019-07-16 | Discharge: 2019-07-16 | Disposition: A | Discharge status: Home / Self Care | Payer: Commercial Managed Care - PPO | Source: Ambulatory Visit | Attending: Cardiology | Admitting: Cardiology

## 2019-07-16 VITALS — BP 130/90 | HR 68 | Ht 71.5 in | Wt 228.0 lb

## 2019-07-16 DIAGNOSIS — I10 Essential (primary) hypertension: Secondary | ICD-10-CM | POA: Diagnosis not present

## 2019-07-16 DIAGNOSIS — I712 Thoracic aortic aneurysm, without rupture, unspecified: Secondary | ICD-10-CM

## 2019-07-16 DIAGNOSIS — E782 Mixed hyperlipidemia: Secondary | ICD-10-CM | POA: Diagnosis not present

## 2019-07-16 DIAGNOSIS — T81509A Unspecified complication of foreign body accidentally left in body following unspecified procedure, initial encounter: Secondary | ICD-10-CM

## 2019-07-16 DIAGNOSIS — I2510 Atherosclerotic heart disease of native coronary artery without angina pectoris: Secondary | ICD-10-CM

## 2019-07-16 DIAGNOSIS — I8393 Asymptomatic varicose veins of bilateral lower extremities: Secondary | ICD-10-CM | POA: Diagnosis not present

## 2019-07-16 MED ORDER — LOSARTAN POTASSIUM 50 MG PO TABS: 50.0000 mg | ORAL_TABLET | Freq: Every day | ORAL | 2 refills | Status: DC

## 2019-07-16 NOTE — Patient Instructions (Signed)
Medication Instructions:   INCREASE YOUR LOSARTAN TO 50 MG ONCE DAILY  If you need a refill on your cardiac medications before your next appointment, please call your pharmacy.     Testing/Procedures:   LEFT LEG/THIGH/FEMUR X-RAY TO INVESTIGATE FOREIGN OBJECT LEFT IN LEG DURING GIVING YOURSELF AN INJECTION   You have been referred to [place] TO SEE DR. MARK FEATHERSTONE FOR VARICOSE VEINS    Follow-Up: At [facility], you and your health needs are our priority.  As part of our continuing mission to provide you with exceptional heart care, we have created designated Provider Care Teams.  These Care Teams include your primary Cardiologist (physician) and Advanced Practice Providers (APPs -  Physician Assistants and Nurse Practitioners) who all work together to provide you with the care you need, when you need it. You will need a follow up appointment in 6 months.  Please call our office 2 months in advance to schedule this appointment.  You may see Ena Dawley, MD or one of the following Advanced Practice Providers on your designated Care Team:   Normanna, PA-C Melina Copa, PA-C . Ermalinda Barrios, PA-C

## 2019-07-16 NOTE — Progress Notes (Signed)
Cardiology Office Note:    Date:  07/16/2019   ID:  Leslee Haueter, DOB Jul 10, 1956, MRN [id number]  PCP:  Angelica Ran, MD  Cardiologist:  Ena Dawley, MD  Electrophysiologist:  None   Referring MD: Angelica Ran, MD   Chief complaint: Varicose veins  History of Present Illness:    Leonard Hawkins is a 63 y.o. male with a hx of premature coronary artery disease in his father, cardiomegaly in his brother, hyperlipidemia who has been experiencing exertional dyspnea on exertion associated with dizziness and diaphoresis and retrosternal chest pressure.   In 2018 coronary CTA showed Ca score 1086. This was 60 percentile for age and sex matched control. Coronary CTA showed two-vessel obstructive coronary artery disease.  Cardiac cath in 04/2017 showed   75% first diagonal obstruction.  40% proximal LAD and 25% mid LAD obstruction.  25% first obtuse marginal  Tandem 40 and 20% RCA stenoses. Normal left ventricular function with EF 55%. Normal hemodynamics.  The patient was started on rosuvastatin did cause significant muscle pain and was eventually transitioned to [place].  He also experienced significant hypotension with lisinopril was discontinued.   His most recent labs from September 2019 showed normal LFTs, triglycerides 157, LDL 53, and HDL 47.  07/16/2019 -the patient is coming after 4 months, he is doing well from cardiac standpoint, he continues to play golf couple times a week with no chest pain or shortness of breath.  He has no lower extremity edema orthopnea proximal nocturnal dyspnea no palpitations.  He is tolerating Repatha well.  Today he injected himself testosterone into his left thigh and needle broke off and per patient probably stayed in his muscle.  He is not in any pain or discomfort.  He also has significant varicose veins will get significantly worse while he plays golf and he would like to have them removed.  Past Medical History:  Diagnosis Date     Asthma    Hyperlipidemia    Past Surgical History:  Procedure Laterality Date   LEFT HEART CATH AND CORONARY ANGIOGRAPHY N/A 05/23/2017   Procedure: Left Heart Cath and Coronary Angiography;  Surgeon: Belva Crome, MD;  Location: [place] [facility];  Service: Cardiovascular;  Laterality: N/A;   Current Medications: Current Meds  Medication Sig   albuterol (PROVENTIL HFA;VENTOLIN HFA) 108 (90 Base) MCG/ACT inhaler Inhale 2 puffs into the lungs as needed.   albuterol (PROVENTIL) (2.5 MG/3ML) 0.083% nebulizer solution TAKE 3 MLS (2.5 MG DOSE) BY NEBULIZATION EVERY 6 (SIX) HOURS AS NEEDED FOR WHEEZING.   B-D INSULIN SYRINGE 1CC/25GX1" 25G X 1" 1 ML MISC    BD DISP NEEDLES 27G X 1/2" MISC USE WITH TESTOSTERONE INJECTION ONCE EVERY 14 DAYS   celecoxib (CELEBREX) 200 MG capsule Take 200 mg by mouth as needed.    esomeprazole (NEXIUM) 40 MG capsule Take 40 mg by mouth daily.    famotidine (PEPCID) 20 MG tablet Take 20 mg by mouth as needed.    fluticasone (FLONASE) 50 MCG/ACT nasal spray ONE SPRAY BY NASAL ROUTE DAILY AS NEEDED FOR RHINITIS.   guaiFENesin (MUCINEX) 600 MG 12 hr tablet Take 600 mg by mouth 2 (two) times daily as needed.    metaxalone (SKELAXIN) 800 MG tablet TAKE 1 TABLET BY MOUTH THREE TIMES A DAY AS NEEDED FOR PAIN   montelukast (SINGULAIR) 10 MG tablet Take 10 mg by mouth daily.   Omega-3 Fatty Acids ([facility] FISH OIL) 1000 MG CAPS TAKE 1 CAPSULE (1,000 MG TOTAL)  BY MOUTH DAILY.   Polyethyl Glycol-Propyl Glycol (SYSTANE OP) Apply 1 drop to eye daily as needed (dry eyes).   REPATHA SURECLICK 140 MG/ML SOAJ INJECT 1 PEN INTO THE SKIN EVERY 14 DAYS   Spacer/Aero-Holding Chambers (OPTICHAMBER DIAMOND) MISC USE WITH HFA INHALER   Syringe, Disposable, 1 ML MISC Use with testosterone injection once every 14 days   testosterone cypionate (DEPOTESTOTERONE CYPIONATE) 100 MG/ML injection Inject into the muscle.   zolpidem (AMBIEN) 5 MG tablet Take 1 tablet by mouth as  needed.   [DISCONTINUED] losartan (COZAAR) 25 MG tablet Take 1 tablet (25 mg total) by mouth daily.     Allergies:   Cashew nut oil, Polyethylene glycol, and Nsaids   Social History   Socioeconomic History   Marital status: Married    Spouse name: Not on file   Number of children: Not on file   Years of education: Not on file   Highest education level: Not on file  Occupational History   Not on file  Social Needs   Financial resource strain: Not on file   Food insecurity    Worry: Not on file    Inability: Not on file   Transportation needs    Medical: Not on file    Non-medical: Not on file  Tobacco Use   Smoking status: Never Smoker   Smokeless tobacco: Former NeurosurgeonUser  Substance and Sexual Activity   Alcohol use: Yes    Comment: social drinker   Drug use: No   Sexual activity: Yes    Birth control/protection: None  Lifestyle   Physical activity    Days per week: Not on file    Minutes per session: Not on file   Stress: Not on file  Relationships   Social connections    Talks on phone: Not on file    Gets together: Not on file    Attends religious service: Not on file    Active member of club or organization: Not on file    Attends meetings of clubs or organizations: Not on file    Relationship status: Not on file  Other Topics Concern   Not on file  Social History Narrative   Not on file     Family History: The patient's family history includes AAA (abdominal aortic aneurysm) in his sister; Heart attack in his father; Heart disease in his father; Ovarian cancer in his mother.  ROS:   Please see the history of present illness.    All other systems reviewed and are negative.  EKGs/Labs/Other Studies Reviewed:    The following studies were reviewed today:  EKG:  EKG is ordered today.  The ekg ordered today demonstrates normal sinus rhythm, normal EKG, unchanged from prior.  This was personally reviewed.  Recent Labs: 11/18/2018: ALT 29;  BUN 16; Creatinine, Ser 1.10; Potassium 4.5; Sodium 138; TSH 1.180  Recent Lipid Panel    Component Value Date/Time   CHOL 128 11/18/2018 0904   TRIG 146 11/18/2018 0904   HDL 50 11/18/2018 0904   CHOLHDL 2.6 11/18/2018 0904   LDLCALC 49 11/18/2018 0904    Physical Exam:    VS:  BP 130/90    Pulse 68    Ht 5' 11.5" (1.816 m)    Wt 228 lb (103.4 kg)    SpO2 96%    BMI 31.36 kg/m     Wt Readings from Last 3 Encounters:  07/16/19 228 lb (103.4 kg)  03/11/19 228 lb (103.4 kg)  10/13/18 231  lb 3.2 oz (104.9 kg)    GEN:  Well nourished, well developed in no acute distress HEENT: Normal NECK: No JVD; No carotid bruits LYMPHATICS: No lymphadenopathy CARDIAC: RRR, no murmurs, rubs, gallops RESPIRATORY:  Clear to auscultation without rales, wheezing or rhonchi  ABDOMEN: Soft, non-tender, non-distended MUSCULOSKELETAL:  No edema; No deformity, significant varicose veins especially behind his knee SKIN: Warm and dry NEUROLOGIC:  Alert and oriented x 3 PSYCHIATRIC:  Normal affect   ASSESSMENT:    1. Foreign object left in body during procedure, initial encounter   2. Varicose veins of both lower extremities, unspecified whether complicated    PLAN:    In order of problems listed above:  1. CAD - with diffuse coronary artery disease on cardiac July 2018 with 75% stenosis in the first diagonal vessel that is small. LVEF 55-60%.  He is asymptomatic, will continue an aggressive medical management, however he couldn't tolerate ASA, but taking Plavix 75 mg po daily instead.  Continue Repatha and fish oil.    2. Hyperlipidemia -tolerating Repatha, lipids at goal while taking it.  3. Ascending aortic aneurysm -stable 43 mm on repeat chest CT on 06/02/2019.  4.  Hypertension with ascending aortic aneurysm, we will restart increase losartan to 50 mg daily.  5.  Suspicion for retained needle within his quadriceps, will obtain left thigh x-ray and if finding of a needle will refer to general  surgery for extraction.  Medication Adjustments/Labs and Tests Ordered: Current medicines are reviewed at length with the patient today.  Concerns regarding medicines are outlined above.  Orders Placed This Encounter  Procedures   DG FEMUR MIN 2 VIEWS LEFT   Ambulatory referral to Vascular Surgery   EKG 12-Lead   Meds ordered this encounter  Medications   losartan (COZAAR) 50 MG tablet    Sig: Take 1 tablet (50 mg total) by mouth daily.    Dispense:  90 tablet    Refill:  2    Patient Instructions  Medication Instructions:   INCREASE YOUR LOSARTAN TO 50 MG ONCE DAILY  If you need a refill on your cardiac medications before your next appointment, please call your pharmacy.     Testing/Procedures:   LEFT LEG/THIGH/FEMUR X-RAY TO INVESTIGATE FOREIGN OBJECT LEFT IN LEG DURING GIVING YOURSELF AN INJECTION   You have been referred to [facility] TO SEE DR. MARK FEATHERSTONE FOR VARICOSE VEINS    Follow-Up: At [facility], you and your health needs are our priority.  As part of our continuing mission to provide you with exceptional heart care, we have created designated Provider Care Teams.  These Care Teams include your primary Cardiologist (physician) and Advanced Practice Providers (APPs -  Physician Assistants and Nurse Practitioners) who all work together to provide you with the care you need, when you need it. You will need a follow up appointment in 6 months.  Please call our office 2 months in advance to schedule this appointment.  You may see Tobias Alexander, MD or one of the following Advanced Practice Providers on your designated Care Team:   Robbie Lis, PA-C Ronie Spies, PA-C  Jacolyn Reedy, PA-C        Signed, Tobias Alexander, MD  07/16/2019 5:16 PM    [facility]

## 2019-07-17 ENCOUNTER — Ambulatory Visit: Payer: Commercial Managed Care - PPO | Admitting: Cardiology

## 2019-07-17 ENCOUNTER — Encounter: Payer: Self-pay | Admitting: *Deleted

## 2019-07-17 NOTE — Telephone Encounter (Signed)
Dorothy Spark, MD  Nuala Alpha, LPN        There is no evidence for a needle in his thigh, it must have fallen somewhere, have him observe the area and if it gets infected have him call us back    Pt made aware of results and signs/symptoms to look for if infection to that area occurs.  He was advised if infection does occur to call us back and let us know. Pt verbalized understanding and agrees with this plan.

## 2019-08-07 ENCOUNTER — Other Ambulatory Visit: Payer: Self-pay | Admitting: Cardiology

## 2019-08-07 DIAGNOSIS — Z01812 Encounter for preprocedural laboratory examination: Secondary | ICD-10-CM

## 2019-08-07 DIAGNOSIS — R931 Abnormal findings on diagnostic imaging of heart and coronary circulation: Secondary | ICD-10-CM

## 2019-08-07 DIAGNOSIS — R072 Precordial pain: Secondary | ICD-10-CM

## 2019-08-07 DIAGNOSIS — R0609 Other forms of dyspnea: Secondary | ICD-10-CM

## 2019-08-07 NOTE — Telephone Encounter (Signed)
Pt's pharmacy is requesting a refill on fish oil. Would Dr. Meda Coffee like to refill this medication? Please address

## 2019-10-26 ENCOUNTER — Telehealth: Payer: Self-pay | Admitting: *Deleted

## 2019-10-26 NOTE — Telephone Encounter (Signed)
Vascular surgery referral Received: 6 days ago Message Contents  Leona Singleton, Charlcie Cradle, LPN  FYI   I called Dr Grafton Folk office to get the status on this referral. They have called the patient several times and the patient never returned their call. Referral is closed.

## 2019-12-15 DIAGNOSIS — Z136 Encounter for screening for cardiovascular disorders: Secondary | ICD-10-CM

## 2019-12-16 ENCOUNTER — Other Ambulatory Visit: Payer: Self-pay | Admitting: Cardiology

## 2019-12-16 NOTE — Telephone Encounter (Signed)
Leonard Masson, MD  Loa Socks, LPN  Lajoyce Corners, can you order abdominal Duplex for AAA screening? Thank you,  KN        Order for AAA Duplex placed for screening of AAA, and pt made aware via his mychart account that our [facility] scheduling will be in contact with him to arrange this test.  Message sent to [facility].

## 2019-12-28 NOTE — Telephone Encounter (Signed)
Appt changed per pt's request see appt's ./cy

## 2019-12-30 ENCOUNTER — Other Ambulatory Visit (HOSPITAL_COMMUNITY): Payer: Commercial Managed Care - PPO

## 2019-12-30 NOTE — Telephone Encounter (Signed)
Pts AAA Duplex is scheduled for 01/14/20 at 0800. Pt made aware of appt date and time by Vascular Scheduler.

## 2020-01-14 ENCOUNTER — Other Ambulatory Visit: Payer: Self-pay

## 2020-01-14 ENCOUNTER — Ambulatory Visit (HOSPITAL_COMMUNITY)
Admission: RE | Admit: 2020-01-14 | Discharge: 2020-01-14 | Disposition: A | Discharge status: Home / Self Care | Payer: Commercial Managed Care - PPO | Source: Ambulatory Visit | Attending: Internal Medicine | Admitting: Internal Medicine

## 2020-01-14 ENCOUNTER — Telehealth: Payer: Self-pay | Admitting: Cardiology

## 2020-01-14 DIAGNOSIS — Z136 Encounter for screening for cardiovascular disorders: Secondary | ICD-10-CM | POA: Diagnosis not present

## 2020-01-14 NOTE — Telephone Encounter (Signed)
   Pt returning call regarding AAA duplex result.  Please call

## 2020-01-14 NOTE — Telephone Encounter (Signed)
Pt has been notified of AAA results. See Results for further info.

## 2020-03-02 ENCOUNTER — Other Ambulatory Visit: Payer: Self-pay

## 2020-03-02 ENCOUNTER — Encounter: Payer: Self-pay | Admitting: Cardiology

## 2020-03-02 ENCOUNTER — Ambulatory Visit (INDEPENDENT_AMBULATORY_CARE_PROVIDER_SITE_OTHER): Payer: Commercial Managed Care - PPO | Admitting: Cardiology

## 2020-03-02 VITALS — BP 126/86 | HR 67 | Ht 71.0 in | Wt 226.8 lb

## 2020-03-02 DIAGNOSIS — I2510 Atherosclerotic heart disease of native coronary artery without angina pectoris: Secondary | ICD-10-CM | POA: Diagnosis not present

## 2020-03-02 DIAGNOSIS — E782 Mixed hyperlipidemia: Secondary | ICD-10-CM | POA: Diagnosis not present

## 2020-03-02 DIAGNOSIS — E785 Hyperlipidemia, unspecified: Secondary | ICD-10-CM

## 2020-03-02 DIAGNOSIS — R931 Abnormal findings on diagnostic imaging of heart and coronary circulation: Secondary | ICD-10-CM | POA: Diagnosis not present

## 2020-03-02 NOTE — Progress Notes (Signed)
Cardiology Office Note:    Date:  03/02/2020   ID:  Leonard Hawkins, DOB 02/20/1956, MRN [id number]  PCP:  Angelica Ran, MD  Cardiologist:  Ena Dawley, MD  Electrophysiologist:  None   Referring MD: Angelica Ran, MD   Chief complaint: Varicose veins  History of Present Illness:    Leonard Hawkins is a 63 y.o. male with a hx of premature coronary artery disease in his father, cardiomegaly in his brother, hyperlipidemia who has been experiencing exertional dyspnea on exertion associated with dizziness and diaphoresis and retrosternal chest pressure.   In 2018 coronary CTA showed Ca score 1086. This was 73 percentile for age and sex matched control. Coronary CTA showed two-vessel obstructive coronary artery disease.  Cardiac cath in 04/2017 showed   75% first diagonal obstruction.  40% proximal LAD and 25% mid LAD obstruction.  25% first obtuse marginal  Tandem 40 and 20% RCA stenoses. Normal left ventricular function with EF 55%. Normal hemodynamics.  The patient was started on rosuvastatin did cause significant muscle pain and was eventually transitioned to [place].  He also experienced significant hypotension with lisinopril was discontinued.   His most recent labs from September 2019 showed normal LFTs, triglycerides 157, LDL 53, and HDL 47.  07/16/2019 -the patient is coming after 4 months, he is doing well from cardiac standpoint, he continues to play golf couple times a week with no chest pain or shortness of breath.  He has no lower extremity edema orthopnea proximal nocturnal dyspnea no palpitations.  He is tolerating Repatha well.  Today he injected himself testosterone into his left thigh and needle broke off and per patient probably stayed in his muscle.  He is not in any pain or discomfort.  He also has significant varicose veins will get significantly worse while he plays golf and he would like to have them removed.  03/02/2020 -the patient is coming after  6 months, he has been doing well, he continues to play golf up to 18 holes, and walks his dog up to 2 miles several times a week.  He denies any chest pain or shortness of breath letter done on strenuous exertion.  He denies any lower extremity edema, in fact he underwent venous surgery that significantly improved his symptoms.  He has no palpitation dizziness or syncope.  He is sleep has improved significantly with use of CPAP machine.   Past Medical History:  Diagnosis Date  . Asthma   . Hyperlipidemia    Past Surgical History:  Procedure Laterality Date  . LEFT HEART CATH AND CORONARY ANGIOGRAPHY N/A 05/23/2017   Procedure: Left Heart Cath and Coronary Angiography;  Surgeon: Belva Crome, MD;  Location: [place] [facility];  Service: Cardiovascular;  Laterality: N/A;   Current Medications: Current Meds  Medication Sig  . albuterol (PROVENTIL HFA;VENTOLIN HFA) 108 (90 Base) MCG/ACT inhaler Inhale 2 puffs into the lungs as needed.  Marland Kitchen albuterol (PROVENTIL) (2.5 MG/3ML) 0.083% nebulizer solution TAKE 3 MLS (2.5 MG DOSE) BY NEBULIZATION EVERY 6 (SIX) HOURS AS NEEDED FOR WHEEZING.  . B-D INSULIN SYRINGE 1CC/25GX1" 25G X 1" 1 ML MISC   . BD DISP NEEDLES 27G X 1/2" MISC USE WITH TESTOSTERONE INJECTION ONCE EVERY 14 DAYS  . celecoxib (CELEBREX) 200 MG capsule Take 200 mg by mouth as needed.   Marland Kitchen esomeprazole (NEXIUM) 40 MG capsule Take 40 mg by mouth daily.   . famotidine (PEPCID) 20 MG tablet Take 20 mg by mouth as needed.   Marland Kitchen  fluticasone (FLONASE) 50 MCG/ACT nasal spray ONE SPRAY BY NASAL ROUTE DAILY AS NEEDED FOR RHINITIS.  Marland Kitchen guaiFENesin (MUCINEX) 600 MG 12 hr tablet Take 600 mg by mouth 2 (two) times daily as needed.   Marland Kitchen losartan (COZAAR) 50 MG tablet Take 1 tablet (50 mg total) by mouth daily.  . metaxalone (SKELAXIN) 800 MG tablet TAKE 1 TABLET BY MOUTH THREE TIMES A DAY AS NEEDED FOR PAIN  . montelukast (SINGULAIR) 10 MG tablet Take 10 mg by mouth daily.  . Omega-3 Fatty Acids ([facility] FISH  OIL) 1000 MG CAPS TAKE 1 CAPSULE (1,000 MG TOTAL) BY MOUTH DAILY.  Vladimir Faster Glycol-Propyl Glycol (SYSTANE OP) Apply 1 drop to eye daily as needed (dry eyes).  Marland Kitchen REPATHA SURECLICK 973 MG/ML SOAJ INJECT 1 PEN INTO THE SKIN EVERY 14 DAYS  . Spacer/Aero-Holding Chambers (OPTICHAMBER DIAMOND) MISC USE WITH HFA INHALER  . Syringe, Disposable, 1 ML MISC Use with testosterone injection once every 14 days  . testosterone cypionate (DEPOTESTOTERONE CYPIONATE) 100 MG/ML injection Inject into the muscle.     Allergies:   Cashew nut oil, Polyethylene glycol, and Nsaids   Social History   Socioeconomic History  . Marital status: Married    Spouse name: Not on file  . Number of children: Not on file  . Years of education: Not on file  . Highest education level: Not on file  Occupational History  . Not on file  Tobacco Use  . Smoking status: Never Smoker  . Smokeless tobacco: Former Network engineer and Sexual Activity  . Alcohol use: Yes    Comment: social drinker  . Drug use: No  . Sexual activity: Yes    Birth control/protection: None  Other Topics Concern  . Not on file  Social History Narrative  . Not on file   Social Determinants of Health   Financial Resource Strain:   . Difficulty of Paying Living Expenses:   Food Insecurity:   . Worried About Charity fundraiser in the Last Year:   . Arboriculturist in the Last Year:   Transportation Needs:   . Film/video editor (Medical):   Marland Kitchen Lack of Transportation (Non-Medical):   Physical Activity:   . Days of Exercise per Week:   . Minutes of Exercise per Session:   Stress:   . Feeling of Stress :   Social Connections:   . Frequency of Communication with Friends and Family:   . Frequency of Social Gatherings with Friends and Family:   . Attends Religious Services:   . Active Member of Clubs or Organizations:   . Attends Archivist Meetings:   Marland Kitchen Marital Status:      Family History: The patient's family history  includes AAA (abdominal aortic aneurysm) in his sister; Heart attack in his father; Heart disease in his father; Ovarian cancer in his mother.  ROS:   Please see the history of present illness.    All other systems reviewed and are negative.  EKGs/Labs/Other Studies Reviewed:    The following studies were reviewed today:  EKG:  EKG is ordered today.  The ekg ordered today demonstrates normal sinus rhythm, normal EKG, unchanged from prior.  This was personally reviewed.  Recent Labs: No results found for requested labs within last 8760 hours.  Recent Lipid Panel    Component Value Date/Time   CHOL 128 11/18/2018 0904   TRIG 146 11/18/2018 0904   HDL 50 11/18/2018 0904   CHOLHDL 2.6 11/18/2018  0904   LDLCALC 49 11/18/2018 0904    Physical Exam:    VS:  BP 126/86   Pulse 67   Ht 5' 11"  (1.803 m)   Wt 226 lb 12.8 oz (102.9 kg)   BMI 31.63 kg/m     Wt Readings from Last 3 Encounters:  03/02/20 226 lb 12.8 oz (102.9 kg)  07/16/19 228 lb (103.4 kg)  03/11/19 228 lb (103.4 kg)    GEN:  Well nourished, well developed in no acute distress HEENT: Normal NECK: No JVD; No carotid bruits LYMPHATICS: No lymphadenopathy CARDIAC: RRR, no murmurs, rubs, gallops RESPIRATORY:  Clear to auscultation without rales, wheezing or rhonchi  ABDOMEN: Soft, non-tender, non-distended MUSCULOSKELETAL:  No edema; No deformity, significant varicose veins especially behind his knee SKIN: Warm and dry NEUROLOGIC:  Alert and oriented x 3 PSYCHIATRIC:  Normal affect   ASSESSMENT:    1. Mixed hyperlipidemia   2. Coronary artery disease involving native coronary artery of native heart without angina pectoris   3. High coronary artery calcium score   4. Hyperlipidemia, unspecified hyperlipidemia type    PLAN:    In order of problems listed above:  1. CAD - with diffuse coronary artery disease on cardiac CT in July 2018 with 75% stenosis in the first diagonal vessel that is small. LVEF 55-60%.   He is asymptomatic, will continue an aggressive medical management, however he couldn't tolerate ASA, but taking Plavix 75 mg po daily instead.  Continue Repatha and fish oil.  We will obtain repeat labs including LFTs and lipids and follow-up in 6 months.  2. Hyperlipidemia -tolerating Repatha, lipids at goal while taking it.  3. Ascending aortic aneurysm -stable 43 mm on repeat chest CT on 06/02/2019.  4.  Hypertension with ascending aortic aneurysm-well-controlled.  Medication Adjustments/Labs and Tests Ordered: Current medicines are reviewed at length with the patient today.  Concerns regarding medicines are outlined above.  Orders Placed This Encounter  Procedures  . Comp Met (CMET)  . CBC  . TSH  . HgB A1c  . Lipid Profile  . EKG 12-Lead   No orders of the defined types were placed in this encounter.   Patient Instructions  Medication Instructions:   Your physician recommends that you continue on your current medications as directed. Please refer to the Current Medication list given to you today.  *If you need a refill on your cardiac medications before your next appointment, please call your pharmacy*   Lab Work:   Monday 03/07/20 --WE WILL CHECK CMET, CBC, TSH, HEMOGLOBIN A1C, AND LIPIDS--PLEASE COME FASTING TO THIS LAB APPOINTMENT  If you have labs (blood work) drawn today and your tests are completely normal, you will receive your results only by: Marland Kitchen MyChart Message (if you have MyChart) OR . A paper copy in the mail If you have any lab test that is abnormal or we need to change your treatment, we will call you to review the results.   Follow-Up: At [facility], you and your health needs are our priority.  As part of our continuing mission to provide you with exceptional heart care, we have created designated Provider Care Teams.  These Care Teams include your primary Cardiologist (physician) and Advanced Practice Providers (APPs -  Physician Assistants and Nurse  Practitioners) who all work together to provide you with the care you need, when you need it.  We recommend signing up for the patient portal called "MyChart".  Sign up information is provided on this After  Visit Summary.  MyChart is used to connect with patients for Virtual Visits (Telemedicine).  Patients are able to view lab/test results, encounter notes, upcoming appointments, etc.  Non-urgent messages can be sent to your provider as well.   To learn more about what you can do with MyChart, go to [email or web address].    Your next appointment:   6 month(s)  The format for your next appointment:   In Person  Provider:   Ena Dawley, MD       Signed, Ena Dawley, MD  03/02/2020 7:16 PM    [place]

## 2020-03-02 NOTE — Patient Instructions (Signed)
Medication Instructions:   Your physician recommends that you continue on your current medications as directed. Please refer to the Current Medication list given to you today.  *If you need a refill on your cardiac medications before your next appointment, please call your pharmacy*   Lab Work:   Monday 03/07/20 --WE WILL CHECK CMET, CBC, TSH, HEMOGLOBIN A1C, AND LIPIDS--PLEASE COME FASTING TO THIS LAB APPOINTMENT  If you have labs (blood work) drawn today and your tests are completely normal, you will receive your results only by: Marland Kitchen MyChart Message (if you have MyChart) OR . A paper copy in the mail If you have any lab test that is abnormal or we need to change your treatment, we will call you to review the results.   Follow-Up: At [facility], you and your health needs are our priority.  As part of our continuing mission to provide you with exceptional heart care, we have created designated Provider Care Teams.  These Care Teams include your primary Cardiologist (physician) and Advanced Practice Providers (APPs -  Physician Assistants and Nurse Practitioners) who all work together to provide you with the care you need, when you need it.  We recommend signing up for the patient portal called "MyChart".  Sign up information is provided on this After Visit Summary.  MyChart is used to connect with patients for Virtual Visits (Telemedicine).  Patients are able to view lab/test results, encounter notes, upcoming appointments, etc.  Non-urgent messages can be sent to your provider as well.   To learn more about what you can do with MyChart, go to [email or web address].    Your next appointment:   6 month(s)  The format for your next appointment:   In Person  Provider:   Tobias Alexander, MD

## 2020-03-07 ENCOUNTER — Other Ambulatory Visit: Payer: Commercial Managed Care - PPO | Admitting: *Deleted

## 2020-03-07 ENCOUNTER — Other Ambulatory Visit: Payer: Self-pay

## 2020-03-07 DIAGNOSIS — E782 Mixed hyperlipidemia: Secondary | ICD-10-CM

## 2020-03-07 DIAGNOSIS — I2510 Atherosclerotic heart disease of native coronary artery without angina pectoris: Secondary | ICD-10-CM

## 2020-03-07 DIAGNOSIS — R931 Abnormal findings on diagnostic imaging of heart and coronary circulation: Secondary | ICD-10-CM

## 2020-03-07 DIAGNOSIS — E785 Hyperlipidemia, unspecified: Secondary | ICD-10-CM

## 2020-03-08 LAB — COMPREHENSIVE METABOLIC PANEL
ALT: 28 IU/L (ref 0–44)
AST: 22 IU/L (ref 0–40)
Albumin/Globulin Ratio: 2 (ref 1.2–2.2)
Albumin: 4.5 g/dL (ref 3.8–4.8)
Alkaline Phosphatase: 45 IU/L (ref 39–117)
BUN/Creatinine Ratio: 15 (ref 10–24)
BUN: 18 mg/dL (ref 8–27)
Bilirubin Total: 0.8 mg/dL (ref 0.0–1.2)
CO2: 24 mmol/L (ref 20–29)
Calcium: 9.5 mg/dL (ref 8.6–10.2)
Chloride: 99 mmol/L (ref 96–106)
Creatinine, Ser: 1.22 mg/dL (ref 0.76–1.27)
GFR calc Af Amer: 72 mL/min/{1.73_m2} (ref >59)
GFR calc non Af Amer: 63 mL/min/{1.73_m2} (ref >59)
Globulin, Total: 2.2 g/dL (ref 1.5–4.5)
Glucose: 123 mg/dL — ABNORMAL HIGH (ref 65–99)
Potassium: 4.5 mmol/L (ref 3.5–5.2)
Sodium: 135 mmol/L (ref 134–144)
Total Protein: 6.7 g/dL (ref 6.0–8.5)

## 2020-03-08 LAB — HEMOGLOBIN A1C
Est. average glucose Bld gHb Est-mCnc: 137 mg/dL
Hgb A1c MFr Bld: 6.4 % — ABNORMAL HIGH (ref 4.8–5.6)

## 2020-03-08 LAB — CBC
Hematocrit: 53.7 % — ABNORMAL HIGH (ref 37.5–51.0)
Hemoglobin: 17.7 g/dL (ref 13.0–17.7)
MCH: 28.7 pg (ref 26.6–33.0)
MCHC: 33 g/dL (ref 31.5–35.7)
MCV: 87 fL (ref 79–97)
Platelets: 222 10*3/uL (ref 150–450)
RBC: 6.17 x10E6/uL — ABNORMAL HIGH (ref 4.14–5.80)
RDW: 12 % (ref 11.6–15.4)
WBC: 6.2 10*3/uL (ref 3.4–10.8)

## 2020-03-08 LAB — LIPID PANEL
Chol/HDL Ratio: 2.7 ratio (ref 0.0–5.0)
Cholesterol, Total: 124 mg/dL (ref 100–199)
HDL: 46 mg/dL (ref >39)
LDL Chol Calc (NIH): 44 mg/dL (ref 0–99)
Triglycerides: 213 mg/dL — ABNORMAL HIGH (ref 0–149)
VLDL Cholesterol Cal: 34 mg/dL (ref 5–40)

## 2020-03-08 LAB — TSH: TSH: 0.927 u[IU]/mL (ref 0.450–4.500)

## 2020-04-02 ENCOUNTER — Other Ambulatory Visit: Payer: Self-pay | Admitting: Cardiology

## 2020-04-03 ENCOUNTER — Other Ambulatory Visit: Payer: Self-pay | Admitting: Cardiology

## 2020-04-03 DIAGNOSIS — R0609 Other forms of dyspnea: Secondary | ICD-10-CM

## 2020-04-03 DIAGNOSIS — R931 Abnormal findings on diagnostic imaging of heart and coronary circulation: Secondary | ICD-10-CM

## 2020-04-03 DIAGNOSIS — Z01812 Encounter for preprocedural laboratory examination: Secondary | ICD-10-CM

## 2020-04-03 DIAGNOSIS — R072 Precordial pain: Secondary | ICD-10-CM

## 2020-04-05 NOTE — Telephone Encounter (Signed)
Pt's pharmacy is requesting a refill on Fish Oil. Would Dr. Delton See like to refill this medication? Please address

## 2020-04-20 ENCOUNTER — Telehealth: Payer: Self-pay | Admitting: Pharmacist

## 2020-04-20 NOTE — Telephone Encounter (Signed)
Prior auth for Repatha denied because of exceeding quantity limit. Called insurance since we did not request quantity exception. New authorization had to be submitted over the phone since their system sees renewal of prior auth before the expiration date as exceeding quantity limit for some reason. Authorization approved through 04/20/21. 

## 2020-08-29 ENCOUNTER — Encounter: Payer: Self-pay | Admitting: Cardiology

## 2020-08-29 ENCOUNTER — Other Ambulatory Visit: Payer: Self-pay

## 2020-08-29 ENCOUNTER — Ambulatory Visit (INDEPENDENT_AMBULATORY_CARE_PROVIDER_SITE_OTHER): Payer: Commercial Managed Care - PPO | Admitting: Cardiology

## 2020-08-29 VITALS — BP 128/82 | HR 68 | Ht 71.0 in | Wt 228.4 lb

## 2020-08-29 DIAGNOSIS — I7121 Aneurysm of the ascending aorta, without rupture: Secondary | ICD-10-CM

## 2020-08-29 DIAGNOSIS — I2510 Atherosclerotic heart disease of native coronary artery without angina pectoris: Secondary | ICD-10-CM | POA: Diagnosis not present

## 2020-08-29 DIAGNOSIS — I10 Essential (primary) hypertension: Secondary | ICD-10-CM

## 2020-08-29 DIAGNOSIS — E785 Hyperlipidemia, unspecified: Secondary | ICD-10-CM | POA: Diagnosis not present

## 2020-08-29 DIAGNOSIS — I712 Thoracic aortic aneurysm, without rupture: Secondary | ICD-10-CM

## 2020-08-29 NOTE — Patient Instructions (Signed)
Medication Instructions:   Your physician recommends that you continue on your current medications as directed. Please refer to the Current Medication list given to you today.  *If you need a refill on your cardiac medications before your next appointment, please call your pharmacy*   Testing/Procedures:   CHEST MRA W/WO CONTRAST TO BE DONE TO FURTHER EVALUATE ASCENDING AORTIC ANEURYSM   Follow-Up: At [facility], you and your health needs are our priority.  As part of our continuing mission to provide you with exceptional heart care, we have created designated Provider Care Teams.  These Care Teams include your primary Cardiologist (physician) and Advanced Practice Providers (APPs -  Physician Assistants and Nurse Practitioners) who all work together to provide you with the care you need, when you need it.  We recommend signing up for the patient portal called "MyChart".  Sign up information is provided on this After Visit Summary.  MyChart is used to connect with patients for Virtual Visits (Telemedicine).  Patients are able to view lab/test results, encounter notes, upcoming appointments, etc.  Non-urgent messages can be sent to your provider as well.   To learn more about what you can do with MyChart, go to [email or web address].    Your next appointment:   6 month(s)  The format for your next appointment:   In Person  Provider:   Tobias Alexander, MD

## 2020-08-29 NOTE — Progress Notes (Signed)
Cardiology Office Note:    Date:  08/29/2020   ID:  Leonard Hawkins Thorman, DOB 1956-09-30, MRN [id number]  PCP:  Garnetta BuddyWilliamson, Edward V, MD  Cardiologist:  Tobias AlexanderKatarina Viriginia Amendola, MD  Electrophysiologist:  None   Referring MD: Garnetta BuddyWilliamson, Edward V, MD   Reason for visit: 6 months follow-up  History of Present Illness:    Leonard Hawkins Leitzke is a 64 y.o. male with a hx of premature coronary artery disease in his father, cardiomegaly in his brother, hyperlipidemia who has been experiencing exertional dyspnea on exertion associated with dizziness and diaphoresis and retrosternal chest pressure.   In 2018 coronary CTA showed Ca score 1086. This was 3797 percentile for age and sex matched control. Coronary CTA showed two-vessel obstructive coronary artery disease.  Cardiac cath in 04/2017 showed   75% first diagonal obstruction.  40% proximal LAD and 25% mid LAD obstruction.  25% first obtuse marginal  Tandem 40 and 20% RCA stenoses. Normal left ventricular function with EF 55%. Normal hemodynamics.  The patient was started on rosuvastatin did cause significant muscle pain and was eventually transitioned to Repatha.  He also experienced significant hypotension with lisinopril was discontinued.   His most recent labs from September 2019 showed normal LFTs, triglycerides 157, LDL 53, and HDL 47.  03/02/2020 -the patient is coming after 6 months, he has been doing well, he continues to play golf up to 18 holes, and walks his dog up to 2 miles several times a week.  He denies any chest pain or shortness of breath letter done on strenuous exertion.  He denies any lower extremity edema, in fact he underwent venous surgery that significantly improved his symptoms.  He has no palpitation dizziness or syncope.  He is sleep has improved significantly with use of CPAP machine.  08/29/2020, the patient is coming after 6 months he has been doing great, he has been compliant with his medications and has no side effects.  He  denies any chest pain and he walks regularly, no dyspnea on exertion but occasionally he gets short of breath at rest.  He has not been using his albuterol inhaler that he has for asthma.  His blood pressure has been controlled.  He denies any lower extremity edema.  He admits to eating more sweets lately.   Past Medical History:  Diagnosis Date  . Asthma   . Hyperlipidemia    Past Surgical History:  Procedure Laterality Date  . LEFT HEART CATH AND CORONARY ANGIOGRAPHY N/A 05/23/2017   Procedure: Left Heart Cath and Coronary Angiography;  Surgeon: Lyn RecordsSmith, Henry W, MD;  [facility];  Service: Cardiovascular;  Laterality: N/A;   Current Medications: Current Meds  Medication Sig  . albuterol (PROVENTIL HFA;VENTOLIN HFA) 108 (90 Base) MCG/ACT inhaler Inhale 2 puffs into the lungs as needed.  Marland Kitchen. albuterol (PROVENTIL) (2.5 MG/3ML) 0.083% nebulizer solution TAKE 3 MLS (2.5 MG DOSE) BY NEBULIZATION EVERY 6 (SIX) HOURS AS NEEDED FOR WHEEZING.  . B-D INSULIN SYRINGE 1CC/25GX1" 25G X 1" 1 ML MISC   . BD DISP NEEDLES 27G X 1/2" MISC USE WITH TESTOSTERONE INJECTION ONCE EVERY 14 DAYS  . celecoxib (CELEBREX) 200 MG capsule Take 200 mg by mouth as needed.   Marland Kitchen. esomeprazole (NEXIUM) 40 MG capsule Take 40 mg by mouth as needed (acid reflux).   . famotidine (PEPCID) 20 MG tablet Take 20 mg by mouth daily.   . fluticasone (FLONASE) 50 MCG/ACT nasal spray ONE SPRAY BY NASAL ROUTE DAILY AS NEEDED FOR  RHINITIS.  Marland Kitchen guaiFENesin (MUCINEX) 600 MG 12 hr tablet Take 600 mg by mouth 2 (two) times daily as needed.   Marland Kitchen losartan (COZAAR) 100 MG tablet TAKE 1/2 TABLET BY MOUTH EVERY DAY  . metaxalone (SKELAXIN) 800 MG tablet TAKE 1 TABLET BY MOUTH THREE TIMES A DAY AS NEEDED FOR PAIN  . montelukast (SINGULAIR) 10 MG tablet Take 10 mg by mouth daily.  . Omega-3 Fatty Acids ([employer] FISH OIL) 1000 MG CAPS TAKE 1 CAPSULE (1,000 MG TOTAL) BY MOUTH DAILY.  Bertram Gala Glycol-Propyl Glycol (SYSTANE OP) Apply 1 drop  to eye daily as needed (dry eyes).  Marland Kitchen REPATHA SURECLICK 140 MG/ML SOAJ INJECT 1 PEN INTO THE SKIN EVERY 14 DAYS  . Spacer/Aero-Holding Chambers (OPTICHAMBER DIAMOND) MISC USE WITH HFA INHALER  . Syringe, Disposable, 1 ML MISC Use with testosterone injection once every 14 days  . testosterone cypionate (DEPOTESTOTERONE CYPIONATE) 100 MG/ML injection Inject into the muscle.     Allergies:   Cashew nut oil, Polyethylene glycol, and Nsaids   Social History   Socioeconomic History  . Marital status: Married    Spouse name: Not on file  . Number of children: Not on file  . Years of education: Not on file  . Highest education level: Not on file  Occupational History  . Not on file  Tobacco Use  . Smoking status: Never Smoker  . Smokeless tobacco: Former Clinical biochemist  . Vaping Use: Never used  Substance and Sexual Activity  . Alcohol use: Yes    Comment: social drinker  . Drug use: No  . Sexual activity: Yes    Birth control/protection: None  Other Topics Concern  . Not on file  Social History Narrative  . Not on file   Social Determinants of Health   Financial Resource Strain:   . Difficulty of Paying Living Expenses: Not on file  Food Insecurity:   . Worried About Programme researcher, broadcasting/film/video in the Last Year: Not on file  . Ran Out of Food in the Last Year: Not on file  Transportation Needs:   . Lack of Transportation (Medical): Not on file  . Lack of Transportation (Non-Medical): Not on file  Physical Activity:   . Days of Exercise per Week: Not on file  . Minutes of Exercise per Session: Not on file  Stress:   . Feeling of Stress : Not on file  Social Connections:   . Frequency of Communication with Friends and Family: Not on file  . Frequency of Social Gatherings with Friends and Family: Not on file  . Attends Religious Services: Not on file  . Active Member of Clubs or Organizations: Not on file  . Attends Banker Meetings: Not on file  . Marital Status:  Not on file     Family History: The patient's family history includes AAA (abdominal aortic aneurysm) in his sister; Heart attack in his father; Heart disease in his father; Ovarian cancer in his mother.  ROS:   Please see the history of present illness.    All other systems reviewed and are negative.  EKGs/Labs/Other Studies Reviewed:    The following studies were reviewed today:  EKG:  EKG is ordered today 08/29/2020.  The ekg ordered today demonstrates normal sinus rhythm, normal EKG, unchanged from prior, heart rate 61 bpm.  This was personally reviewed.  Recent Labs: 03/07/2020: ALT 28; BUN 18; Creatinine, Ser 1.22; Hemoglobin 17.7; Platelets 222; Potassium 4.5; Sodium 135; TSH 0.927  Recent Lipid Panel    Component Value Date/Time   CHOL 124 03/07/2020 0826   TRIG 213 (H) 03/07/2020 0826   HDL 46 03/07/2020 0826   CHOLHDL 2.7 03/07/2020 0826   LDLCALC 44 03/07/2020 0826    Physical Exam:    VS:  BP 128/82   Pulse 68   Ht 5\' 11"  (1.803 m)   Wt 228 lb 6.4 oz (103.6 kg)   SpO2 93%   BMI 31.86 kg/m     Wt Readings from Last 3 Encounters:  08/29/20 228 lb 6.4 oz (103.6 kg)  03/02/20 226 lb 12.8 oz (102.9 kg)  07/16/19 228 lb (103.4 kg)    GEN:  Well nourished, well developed in no acute distress HEENT: Normal NECK: No JVD; No carotid bruits LYMPHATICS: No lymphadenopathy CARDIAC: RRR, no murmurs, rubs, gallops RESPIRATORY:  Clear to auscultation without rales, wheezing or rhonchi  ABDOMEN: Soft, non-tender, non-distended MUSCULOSKELETAL:  No edema; No deformity, significant varicose veins especially behind his knee SKIN: Warm and dry NEUROLOGIC:  Alert and oriented x 3 PSYCHIATRIC:  Normal affect   ASSESSMENT:    1. Ascending aortic aneurysm (HCC)    PLAN:    In order of problems listed above:  1. CAD - with diffuse coronary artery disease on cardiac CT in July 2018 with 75% stenosis in the first diagonal vessel that is small. LVEF 55-60%.  He is  asymptomatic, will continue an aggressive medical management, however he couldn't tolerate ASA, but taking Plavix 75 mg po daily instead.  Continue Repatha and fish oil.  He is LDL has been at goal at 44, triglycerides elevated, he understand he has to limit sweets from his diet.  2. Hyperlipidemia -tolerating Repatha, LDL at goal, triglycerides will be better achieved with better diabetes control.  3. Ascending aortic aneurysm -stable 43 mm on repeat chest CT on 06/02/2019.  We will obtain chest MRA now.  4.  Hypertension with ascending aortic aneurysm-well-controlled.  Medication Adjustments/Labs and Tests Ordered: Current medicines are reviewed at length with the patient today.  Concerns regarding medicines are outlined above.  Orders Placed This Encounter  Procedures  . MR Angiogram Chest W Wo Contrast  . Basic metabolic panel  . EKG 12-Lead   No orders of the defined types were placed in this encounter.   Patient Instructions  Medication Instructions:   Your physician recommends that you continue on your current medications as directed. Please refer to the Current Medication list given to you today.  *If you need a refill on your cardiac medications before your next appointment, please call your pharmacy*   Testing/Procedures:   CHEST MRA W/WO CONTRAST TO BE DONE TO FURTHER EVALUATE ASCENDING AORTIC ANEURYSM   Follow-Up: At [facility], you and your health needs are our priority.  As part of our continuing mission to provide you with exceptional heart care, we have created designated Provider Care Teams.  These Care Teams include your primary Cardiologist (physician) and Advanced Practice Providers (APPs -  Physician Assistants and Nurse Practitioners) who all work together to provide you with the care you need, when you need it.  We recommend signing up for the patient portal called "MyChart".  Sign up information is provided on this After Visit Summary.  MyChart is used to  connect with patients for Virtual Visits (Telemedicine).  Patients are able to view lab/test results, encounter notes, upcoming appointments, etc.  Non-urgent messages can be sent to your provider as well.   To learn more  about what you can do with MyChart, go to [email or web address].    Your next appointment:   6 month(s)  The format for your next appointment:   In Person  Provider:   Tobias Alexander, MD       Signed, Tobias Alexander, MD  08/29/2020 1:01 PM    [facility]

## 2020-09-08 ENCOUNTER — Encounter (HOSPITAL_COMMUNITY): Payer: Self-pay

## 2020-09-08 ENCOUNTER — Ambulatory Visit (HOSPITAL_COMMUNITY)
Admission: RE | Admit: 2020-09-08 | Discharge: 2020-09-08 | Disposition: A | Discharge status: Home / Self Care | Payer: Commercial Managed Care - PPO | Source: Ambulatory Visit | Attending: Cardiology | Admitting: Cardiology

## 2020-09-08 ENCOUNTER — Other Ambulatory Visit: Payer: Self-pay

## 2020-09-08 DIAGNOSIS — I7121 Aneurysm of the ascending aorta, without rupture: Secondary | ICD-10-CM

## 2020-09-08 DIAGNOSIS — I712 Thoracic aortic aneurysm, without rupture: Secondary | ICD-10-CM

## 2020-09-08 NOTE — Progress Notes (Signed)
Patient came here today for his MRI and stated he was slightly claustrophobic. With that in mind we continued to proceed with the process of the exam. Once we got the patient into the MRI machine he immediately began to freak out and wanted to get out. I asked him if he would like to try again, he said he really does not think he could do this. I told him to talk to his Provider and maybe get some medicine that will help him relax prior to his exam. I told him he would need to have a driver with him if he takes medicine. He will reschedule the MRI after talking to his doctor.

## 2020-09-13 ENCOUNTER — Other Ambulatory Visit: Payer: Commercial Managed Care - PPO

## 2020-09-13 MED ORDER — ALPRAZOLAM 1 MG PO TABS: ORAL_TABLET | ORAL | 0 refills | Status: DC

## 2020-09-13 NOTE — Telephone Encounter (Signed)
MRI  Lars Masson, MD  You 13 minutes ago (8:49 AM)   Lajoyce Corners,  Can you send him a prescription for two Xanax 1 mg pills? No refills, he should only take one 1 hour prior to the scan and have the other one just in case.  He will need a dedicated driver to and from the test.  Thank you,  KN     Phoned in the pts premedication orders, as advised above by Dr. Delton See.  Spoke to the pts Pharmacist at his pharmacy, and endorsed that per Dr. Delton See, pt should be giving Xanax 1 mg po 1 hour prior to his scan, then have the other 1 mg tablet on-hand just incase he needs to have this during the scan.  #2 tablets to dispense with 0 refills.  Pharmacist verbalized understanding and agreed with this plan. Pt made aware of pre-med orders and how to take this medication prior to his MRA.  Pt made aware of this via mychart message.  Will send [facility] a message to call the pt back and reschedule his MRA, being he now has pre-meds for this.  Pt is aware our [facility] Scheduler will call him back to arrange this.

## 2020-09-13 NOTE — Telephone Encounter (Signed)
RE: reschedule pts MRA of chest w/wo contrast per Nelson--he has pre-meds now Received: Today Janett Billow, LPN [id number] @ 12    Pt is rescheduled for his MRA of the Chest W/WO contrast for 09/26/20 at 12 pm.  Pt made aware of appt date and time by [employer] scheduling.

## 2020-09-14 ENCOUNTER — Other Ambulatory Visit: Payer: Self-pay

## 2020-09-14 ENCOUNTER — Other Ambulatory Visit: Payer: Commercial Managed Care - PPO

## 2020-09-26 ENCOUNTER — Other Ambulatory Visit: Payer: Self-pay

## 2020-09-26 ENCOUNTER — Ambulatory Visit (HOSPITAL_COMMUNITY)
Admission: RE | Admit: 2020-09-26 | Discharge: 2020-09-26 | Disposition: A | Discharge status: Home / Self Care | Payer: Commercial Managed Care - PPO | Source: Ambulatory Visit | Attending: Cardiology | Admitting: Cardiology

## 2020-09-26 DIAGNOSIS — I712 Thoracic aortic aneurysm, without rupture: Secondary | ICD-10-CM | POA: Insufficient documentation

## 2020-09-26 MED ORDER — GADOBUTROL 1 MMOL/ML IV SOLN
10.0000 mL | Freq: Once | INTRAVENOUS | Status: AC | PRN
  Administered 2020-09-26: 10 mL via INTRAVENOUS

## 2020-11-22 ENCOUNTER — Other Ambulatory Visit: Payer: Self-pay | Admitting: Cardiology

## 2020-12-05 NOTE — Telephone Encounter (Signed)
Will route this message to our Pharmacist in Lipid clinic, for they follow the pts Repatha, and would be able to accurately advise on [employer], and if this is sufficient to cover his Repatha, or should he seek another carrier.  Pt will be due to see [place] in April 2022.  He will be establishing with Dr. Shari Prows at that time.  Will send this message to PharmD and Dr. Delton See for input.  Pt made aware via mychart message that they will follow-up with him.

## 2020-12-31 ENCOUNTER — Other Ambulatory Visit: Payer: Self-pay | Admitting: Cardiology

## 2021-01-04 MED ORDER — LOSARTAN POTASSIUM 100 MG PO TABS: 50.0000 mg | ORAL_TABLET | Freq: Every day | ORAL | 7 refills | Status: DC

## 2021-03-05 NOTE — Progress Notes (Signed)
Cardiology Office Note:    Date:  03/08/2021   ID:  Leonard Hawkins, DOB [birth year], MRN [id number]  PCP:  Garnetta Buddy, MD   [facility] Providers Cardiologist:  Tobias Alexander, MD (Inactive) {     Referring MD: Garnetta Buddy, MD    History of Present Illness:    Leonard Hawkins is a 65 y.o. male with a hx of moderate CAD on cath in 2018 on medical management  family history of premature CAD in his father and HLD who was previously followed by Dr. Delton See now returning to clinic for follow-up.  In 2018 coronary CTA showed Ca score 1086. This was 42 percentile for age and sex matched control. Coronary CTA showed two-vessel obstructive coronary artery disease.  Cardiac cath in 04/2017 showed   75% first diagonal obstruction.  40% proximal LAD and 25% mid LAD obstruction.  25% first obtuse marginal  Tandem 40 and 20% RCA stenoses. Normal left ventricular function with EF 55%. Normal hemodynamics.  The patient was started on rosuvastatin, but did not tolerate it due to significant muscle pain and was eventually transitioned to Repatha.  He also experienced significant hypotension with lisinopril was discontinued.   Last saw Dr. Delton See on 08/29/2020 where he was doing well with no anginal symptoms.  Today, the patient states that he is overall doing well. Has occasional palpitations especially after a salty meal or fatty meal. No chest pain, SOB, lightheadedness or dizziness. Blood pressure is well controlled. Has been compliant with his CPAP. Patient is active and has no exertional symptoms. Tolerating all medications as prescribed.     Past Medical History:  Diagnosis Date  . Asthma   . Hyperlipidemia     Past Surgical History:  Procedure Laterality Date  . LEFT HEART CATH AND CORONARY ANGIOGRAPHY N/A 05/23/2017   Procedure: Left Heart Cath and Coronary Angiography;  Surgeon: Lyn Records, MD;  Location: [facility] INVASIVE CV LAB;  Service: Cardiovascular;   Laterality: N/A;    Current Medications: Current Meds  Medication Sig  . albuterol (PROVENTIL HFA;VENTOLIN HFA) 108 (90 Base) MCG/ACT inhaler Inhale 2 puffs into the lungs as needed.  Marland Kitchen albuterol (PROVENTIL) (2.5 MG/3ML) 0.083% nebulizer solution Take 3 mg by nebulization every 6 (six) hours as needed.  . B-D INSULIN SYRINGE 1CC/25GX1" 25G X 1" 1 ML MISC   . BD DISP NEEDLES 27G X 1/2" MISC USE WITH TESTOSTERONE INJECTION ONCE EVERY 14 DAYS  . celecoxib (CELEBREX) 200 MG capsule Take 200 mg by mouth as needed.   Marland Kitchen esomeprazole (NEXIUM) 40 MG capsule Take 40 mg by mouth as needed (acid reflux).   . famotidine (PEPCID) 20 MG tablet Take 20 mg by mouth daily.   . fluticasone (FLONASE) 50 MCG/ACT nasal spray ONE SPRAY BY NASAL ROUTE DAILY AS NEEDED FOR RHINITIS.  Marland Kitchen guaiFENesin (MUCINEX) 600 MG 12 hr tablet Take 600 mg by mouth 2 (two) times daily as needed.   Marland Kitchen losartan (COZAAR) 50 MG tablet Take 1 tablet (50 mg total) by mouth daily.  . metaxalone (SKELAXIN) 800 MG tablet TAKE 1 TABLET BY MOUTH THREE TIMES A DAY AS NEEDED FOR PAIN  . Omega-3 Fatty Acids ([employer] FISH OIL) 1000 MG CAPS TAKE 1 CAPSULE (1,000 MG TOTAL) BY MOUTH DAILY.  Bertram Gala Glycol-Propyl Glycol (SYSTANE OP) Apply 1 drop to eye daily as needed (dry eyes).  Marland Kitchen REPATHA SURECLICK 140 MG/ML SOAJ INJECT 1 PEN INTO THE SKIN EVERY 14 DAYS  . Spacer/Aero-Holding Deretha [facility] DIAMOND) MISC  USE WITH HFA INHALER  . Syringe, Disposable, 1 ML MISC Use with testosterone injection once every 14 days  . testosterone cypionate (DEPOTESTOTERONE CYPIONATE) 100 MG/ML injection Inject into the muscle. Only uses occasionally  . [DISCONTINUED] losartan (COZAAR) 100 MG tablet Take 0.5 tablets (50 mg total) by mouth daily.     Allergies:   Cashew nut oil, Polyethylene glycol, and Nsaids   Social History   Socioeconomic History  . Marital status: Married    Spouse name: Not on file  . Number of children: Not on file  . Years of education:  Not on file  . Highest education level: Not on file  Occupational History  . Not on file  Tobacco Use  . Smoking status: Never Smoker  . Smokeless tobacco: Former Clinical biochemistUser  Vaping Use  . Vaping Use: Never used  Substance and Sexual Activity  . Alcohol use: Yes    Comment: social drinker  . Drug use: No  . Sexual activity: Yes    Birth control/protection: None  Other Topics Concern  . Not on file  Social History Narrative  . Not on file   Social Determinants of Health   Financial Resource Strain: Not on file  Food Insecurity: Not on file  Transportation Needs: Not on file  Physical Activity: Not on file  Stress: Not on file  Social Connections: Not on file     Family History: The patient's family history includes AAA (abdominal aortic aneurysm) in his sister; Heart attack in his father; Heart disease in his father; Ovarian cancer in his mother.  ROS:   Please see the history of present illness.    Review of Systems  Constitutional: Negative for chills and fever.  HENT: Negative for sore throat.   Eyes: Negative for blurred vision and redness.  Respiratory: Negative for shortness of breath.   Cardiovascular: Negative for chest pain, palpitations, orthopnea, claudication, leg swelling and PND.  Gastrointestinal: Positive for heartburn. Negative for melena.  Genitourinary: Negative for dysuria and flank pain.  Musculoskeletal: Negative for myalgias.  Neurological: Negative for dizziness and loss of consciousness.  Endo/Heme/Allergies: Negative for polydipsia.  Psychiatric/Behavioral: Negative for substance abuse.    EKGs/Labs/Other Studies Reviewed:    The following studies were reviewed today: Cath 2018:  Left dominant coronary anatomy.  75% first diagonal obstruction.  40% proximal LAD and 25% mid LAD obstruction.  25% first obtuse marginal  Tandem 40 and 20% RCA stenoses.  Normal left ventricular function with EF 55%. Normal  hemodynamics.  RECOMMENDATIONS:   Aggressive risk factor modification  TTE 2020: Study Conclusions   - Left ventricle: The cavity size was normal. Systolic function was  normal. The estimated ejection fraction was in the range of 60%  to 65%. Wall motion was normal; there were no regional wall  motion abnormalities. Doppler parameters are consistent with  abnormal left ventricular relaxation (grade 1 diastolic  dysfunction).  - Aortic valve: Transvalvular velocity was within the normal range.  There was no stenosis. There was no regurgitation.  - Aorta: Ascending aortic diameter: 46 mm (S).  - Ascending aorta: The ascending aorta was moderately dilated.  - Mitral valve: Transvalvular velocity was within the normal range.  There was no evidence for stenosis. There was trivial  regurgitation.  - Right ventricle: The cavity size was normal. Wall thickness was  normal. Systolic function was normal.  - Atrial septum: No defect or patent foramen ovale was identified  by color flow Doppler.  - Tricuspid valve: There  was mild regurgitation.  - Pulmonary arteries: Systolic pressure was within the normal  range. PA peak pressure: 23 mm Hg (S).  - Global longitudinal strain -15.0% (abnormal).  MRA aorta 09/26/20: FINDINGS: VASCULAR  Aorta: Aortic dimensions appears stable with the root measuring approximately 3.9-4.2 cm at the level of the sinuses of Valsalva. The ascending thoracic aorta measures 4.2 cm in greatest diameter. The proximal arch measures 3.3 cm and the distal arch 2.7 cm. The descending thoracic aorta measures 2.5 cm. There is no evidence of aortic dissection. Proximal great vessels demonstrate normal patency.  Heart: The heart size is within normal limits. No pericardial fluid identified.  Pulmonary Arteries: Central pulmonary arteries are normally patent and are of normal caliber.  NON-VASCULAR  Non-vascular evaluation  demonstrates no incidental masses or lymphadenopathy. No pleural fluid identified. Visualized upper abdomen and bony structures are unremarkable.  IMPRESSION: Stable aneurysmal disease of the aortic root and ascending thoracic aorta with maximal diameter of 4.2 cm.  EKG:  EKG is  ordered today.  The ekg ordered today demonstrates NSR with HR 67  Recent Labs: No results found for requested labs within last 8760 hours.  Recent Lipid Panel    Component Value Date/Time   CHOL 124 03/07/2020 0826   TRIG 213 (H) 03/07/2020 0826   HDL 46 03/07/2020 0826   CHOLHDL 2.7 03/07/2020 0826   LDLCALC 44 03/07/2020 0826      Physical Exam:    VS:  BP 120/82   Pulse 67   Ht 5\' 11"  (1.803 m)   Wt 228 lb (103.4 kg)   SpO2 95%   BMI 31.80 kg/m     Wt Readings from Last 3 Encounters:  03/08/21 228 lb (103.4 kg)  08/29/20 228 lb 6.4 oz (103.6 kg)  03/02/20 226 lb 12.8 oz (102.9 kg)     GEN:  Well nourished, well developed in no acute distress HEENT: Normal NECK: No JVD; No carotid bruits CARDIAC: RRR, 1/6 systolic murmur. No rubs, gallops RESPIRATORY:  Clear to auscultation without rales, wheezing or rhonchi  ABDOMEN: Soft, non-tender, non-distended MUSCULOSKELETAL:  No edema; No deformity  SKIN: Warm and dry NEUROLOGIC:  Alert and oriented x 3 PSYCHIATRIC:  Normal affect   ASSESSMENT:    1. Coronary artery disease involving native coronary artery of native heart without angina pectoris   2. Ascending aortic aneurysm (HCC)   3. Encounter for laboratory examination   4. Hyperlipidemia, unspecified hyperlipidemia type   5. Essential hypertension   6. Thoracic aortic aneurysm without rupture (HCC)   7. Mixed hyperlipidemia    PLAN:    In order of problems listed above:  #Coronary artery disease: Patient with diffuse coronary artery disease on cardiac CT in July 2018 with 75% stenosis in the first diagonal vessel that is small. LVEF 55-60%.  He is asymptomatic. Will continue  aggressive medical management. Did not tolerate ASA and therefore is on plavix. -Continue Repatha and fish oil -Continue losartan 50mg  daily  #Hyperlipidemia: Tolerating Repatha, LDL at goal, triglycerides will be better achieved with better diabetes control. -Continue repatha 140mg  q2 weeks  #Ascending aortic aneurysm: Stable on cardiac MRI in 2021 with aortic root and ascending aorta measuring 4.2cm. -Serial annual monitoring with MRIs  #Hypertension with ascending aortic aneurysm: -Continue losartan 50mg  daily  #DMII: Well controlled. BG <120 at home. Hg A1C 6.4. -Diet controlled     Medication Adjustments/Labs and Tests Ordered: Current medicines are reviewed at length with the patient today.  Concerns regarding medicines  are outlined above.  Orders Placed This Encounter  Procedures  . MR Angiogram Chest W Wo Contrast  . Basic metabolic panel  . EKG 12-Lead   Meds ordered this encounter  Medications  . ALPRAZolam (XANAX) 1 MG tablet    Sig: Take 1 tablet (1 mg total) by mouth 1 hour prior to your scan. Have the other 1 mg tablet on-hand just incase you need it during your scan.    Dispense:  2 tablet    Refill:  0  . losartan (COZAAR) 50 MG tablet    Sig: Take 1 tablet (50 mg total) by mouth daily.    Dispense:  90 tablet    Refill:  2    Patient Instructions  Medication Instructions:   Your physician recommends that you continue on your current medications as directed. Please refer to the Current Medication list given to you today.  *If you need a refill on your cardiac medications before your next appointment, please call your pharmacy*   Testing/Procedures:  MRA OF THE CHEST WITH/WITHOUT CONTRAST   Follow-Up: At [facility], you and your health needs are our priority.  As part of our continuing mission to provide you with exceptional heart care, we have created designated Provider Care Teams.  These Care Teams include your primary Cardiologist  (physician) and Advanced Practice Providers (APPs -  Physician Assistants and Nurse Practitioners) who all work together to provide you with the care you need, when you need it.  We recommend signing up for the patient portal called "MyChart".  Sign up information is provided on this After Visit Summary.  MyChart is used to connect with patients for Virtual Visits (Telemedicine).  Patients are able to view lab/test results, encounter notes, upcoming appointments, etc.  Non-urgent messages can be sent to your provider as well.   To learn more about what you can do with MyChart, go to [email or web address].    Your next appointment:   6 month(s)  The format for your next appointment:   In Person  Provider:   You will see one of the following Advanced Practice Providers on your designated Care Team:    Tereso Newcomer, PA-C  [place], [place]         Signed, Meriam Sprague, MD  03/08/2021 3:41 PM    [facility]

## 2021-03-08 ENCOUNTER — Encounter: Payer: Self-pay | Admitting: Cardiology

## 2021-03-08 ENCOUNTER — Other Ambulatory Visit: Payer: Self-pay

## 2021-03-08 ENCOUNTER — Ambulatory Visit (INDEPENDENT_AMBULATORY_CARE_PROVIDER_SITE_OTHER): Payer: Commercial Managed Care - PPO | Admitting: Cardiology

## 2021-03-08 VITALS — BP 120/82 | HR 67 | Ht 71.0 in | Wt 228.0 lb

## 2021-03-08 DIAGNOSIS — I10 Essential (primary) hypertension: Secondary | ICD-10-CM

## 2021-03-08 DIAGNOSIS — Z0189 Encounter for other specified special examinations: Secondary | ICD-10-CM

## 2021-03-08 DIAGNOSIS — I2510 Atherosclerotic heart disease of native coronary artery without angina pectoris: Secondary | ICD-10-CM

## 2021-03-08 DIAGNOSIS — I712 Thoracic aortic aneurysm, without rupture, unspecified: Secondary | ICD-10-CM

## 2021-03-08 DIAGNOSIS — E782 Mixed hyperlipidemia: Secondary | ICD-10-CM

## 2021-03-08 DIAGNOSIS — I7121 Aneurysm of the ascending aorta, without rupture: Secondary | ICD-10-CM

## 2021-03-08 DIAGNOSIS — E785 Hyperlipidemia, unspecified: Secondary | ICD-10-CM | POA: Diagnosis not present

## 2021-03-08 MED ORDER — ALPRAZOLAM 1 MG PO TABS: ORAL_TABLET | ORAL | 0 refills | Status: AC

## 2021-03-08 MED ORDER — LOSARTAN POTASSIUM 50 MG PO TABS: 50.0000 mg | ORAL_TABLET | Freq: Every day | ORAL | 2 refills | Status: DC

## 2021-03-08 NOTE — Patient Instructions (Signed)
Medication Instructions:   Your physician recommends that you continue on your current medications as directed. Please refer to the Current Medication list given to you today.  *If you need a refill on your cardiac medications before your next appointment, please call your pharmacy*   Testing/Procedures:  MRA OF THE CHEST WITH/WITHOUT CONTRAST   Follow-Up: At [facility], you and your health needs are our priority.  As part of our continuing mission to provide you with exceptional heart care, we have created designated Provider Care Teams.  These Care Teams include your primary Cardiologist (physician) and Advanced Practice Providers (APPs -  Physician Assistants and Nurse Practitioners) who all work together to provide you with the care you need, when you need it.  We recommend signing up for the patient portal called "MyChart".  Sign up information is provided on this After Visit Summary.  MyChart is used to connect with patients for Virtual Visits (Telemedicine).  Patients are able to view lab/test results, encounter notes, upcoming appointments, etc.  Non-urgent messages can be sent to your provider as well.   To learn more about what you can do with MyChart, go to [email or web address].    Your next appointment:   6 month(s)  The format for your next appointment:   In Person  Provider:   You will see one of the following Advanced Practice Providers on your designated Care Team:    Tereso Newcomer, PA-C  [place], [place]

## 2021-03-10 ENCOUNTER — Telehealth: Payer: Self-pay | Admitting: *Deleted

## 2021-03-10 NOTE — Telephone Encounter (Signed)
-----   Message from Pricilla Holm sent at 03/10/2021  2:30 PM EDT ----- Regarding: RE: MRA CHEST W/WO CONTRAST Schedule for 03-28-21 @ [facility]   ----- Message ----- From: Loa Socks, LPN Sent: [id number]   3:45 PM EDT To: Pricilla Holm, [facility] Subject: FW: MRA CHEST W/WO CONTRAST                     ----- Message ----- From: Loa Socks, LPN Sent: [id number]/16/[id number]   4:08 PM EDT To: Loa Socks, LPN, Pricilla Holm, # Subject: MRA CHEST W/WO CONTRAST                        Pt needs MRA Chest W/WO Contrast to be done per [place].  For ascending aortic aneurysm.  Order in please arrange?  Thanks so much, [employer]

## 2021-03-10 NOTE — Telephone Encounter (Signed)
Pre-meds given to the pt at last office visit with [place] this week.

## 2021-03-14 ENCOUNTER — Other Ambulatory Visit: Payer: Commercial Managed Care - PPO

## 2021-03-15 ENCOUNTER — Other Ambulatory Visit: Payer: Commercial Managed Care - PPO | Admitting: *Deleted

## 2021-03-15 ENCOUNTER — Other Ambulatory Visit: Payer: Self-pay

## 2021-03-15 DIAGNOSIS — I7121 Aneurysm of the ascending aorta, without rupture: Secondary | ICD-10-CM

## 2021-03-15 DIAGNOSIS — Z0189 Encounter for other specified special examinations: Secondary | ICD-10-CM

## 2021-03-15 DIAGNOSIS — I712 Thoracic aortic aneurysm, without rupture: Secondary | ICD-10-CM

## 2021-03-15 LAB — BASIC METABOLIC PANEL
BUN/Creatinine Ratio: 11 (ref 10–24)
BUN: 13 mg/dL (ref 8–27)
CO2: 23 mmol/L (ref 20–29)
Calcium: 9.5 mg/dL (ref 8.6–10.2)
Chloride: 100 mmol/L (ref 96–106)
Creatinine, Ser: 1.16 mg/dL (ref 0.76–1.27)
Glucose: 141 mg/dL — ABNORMAL HIGH (ref 65–99)
Potassium: 4.3 mmol/L (ref 3.5–5.2)
Sodium: 138 mmol/L (ref 134–144)
eGFR: 70 mL/min/{1.73_m2} (ref >59)

## 2021-03-28 ENCOUNTER — Other Ambulatory Visit: Payer: Self-pay

## 2021-03-28 ENCOUNTER — Ambulatory Visit (HOSPITAL_COMMUNITY)
Admission: RE | Admit: 2021-03-28 | Discharge: 2021-03-28 | Disposition: A | Discharge status: Home / Self Care | Payer: Commercial Managed Care - PPO | Source: Ambulatory Visit | Attending: Cardiology | Admitting: Cardiology

## 2021-03-28 DIAGNOSIS — I712 Thoracic aortic aneurysm, without rupture: Secondary | ICD-10-CM | POA: Insufficient documentation

## 2021-03-28 DIAGNOSIS — I7121 Aneurysm of the ascending aorta, without rupture: Secondary | ICD-10-CM

## 2021-03-28 DIAGNOSIS — Z0189 Encounter for other specified special examinations: Secondary | ICD-10-CM | POA: Diagnosis present

## 2021-03-28 MED ORDER — GADOBUTROL 1 MMOL/ML IV SOLN
10.0000 mL | Freq: Once | INTRAVENOUS | Status: AC | PRN
  Administered 2021-03-28: 10 mL via INTRAVENOUS

## 2021-03-30 ENCOUNTER — Telehealth: Payer: Self-pay

## 2021-03-30 DIAGNOSIS — I712 Thoracic aortic aneurysm, without rupture, unspecified: Secondary | ICD-10-CM

## 2021-03-30 DIAGNOSIS — I7121 Aneurysm of the ascending aorta, without rupture: Secondary | ICD-10-CM

## 2021-03-30 DIAGNOSIS — Z136 Encounter for screening for cardiovascular disorders: Secondary | ICD-10-CM

## 2021-03-30 NOTE — Telephone Encounter (Signed)
-----   Message from Meriam Sprague, MD sent at 03/28/2021  9:52 PM EDT ----- His aorta size is stable on his MRI measuring 4.2cm. We will continue yearly monitoring with MRIs.

## 2021-03-30 NOTE — Telephone Encounter (Signed)
Pt verbalized understanding of his results and will plan to have repeated in one year.

## 2021-04-12 NOTE — Telephone Encounter (Signed)
Pt is scheduled for repeat MRA of the Chest to be done in one year, on 02/23/2022 at 1100.  Pt made aware of appt date and time by [facility] Scheduling.

## 2021-09-29 ENCOUNTER — Ambulatory Visit: Payer: Commercial Managed Care - PPO | Admitting: Physician Assistant

## 2021-10-24 ENCOUNTER — Other Ambulatory Visit: Payer: Self-pay | Admitting: Pharmacist

## 2021-10-24 MED ORDER — REPATHA SURECLICK 140 MG/ML ~~LOC~~ SOAJ: SUBCUTANEOUS | 11 refills | Status: AC

## 2022-01-01 ENCOUNTER — Other Ambulatory Visit: Payer: Self-pay

## 2022-01-01 MED ORDER — LOSARTAN POTASSIUM 50 MG PO TABS: 50.0000 mg | ORAL_TABLET | Freq: Every day | ORAL | 0 refills | Status: DC

## 2022-02-19 ENCOUNTER — Encounter: Payer: Self-pay | Admitting: Cardiology

## 2022-02-23 ENCOUNTER — Encounter (HOSPITAL_COMMUNITY): Payer: Self-pay

## 2022-02-23 ENCOUNTER — Ambulatory Visit (HOSPITAL_COMMUNITY): Payer: Self-pay | Attending: Cardiology

## 2022-03-29 ENCOUNTER — Other Ambulatory Visit: Payer: Self-pay

## 2022-03-29 MED ORDER — LOSARTAN POTASSIUM 50 MG PO TABS: 50.0000 mg | ORAL_TABLET | Freq: Every day | ORAL | 0 refills | Status: AC
